# Patient Record
Sex: Male | Born: 1995
Health system: Southern US, Community
[De-identification: ages and names within clinical notes are randomized; demographics above are authoritative.]

## PROBLEM LIST (undated history)

## (undated) DIAGNOSIS — L709 Acne, unspecified: Secondary | ICD-10-CM

## (undated) DIAGNOSIS — F909 Attention-deficit hyperactivity disorder, unspecified type: Secondary | ICD-10-CM

## (undated) DIAGNOSIS — R011 Cardiac murmur, unspecified: Secondary | ICD-10-CM

## (undated) HISTORY — DX: Attention-deficit hyperactivity disorder, unspecified type: F90.9

## (undated) HISTORY — DX: Cardiac murmur, unspecified: R01.1

## (undated) HISTORY — DX: Acne, unspecified: L70.9

## (undated) HISTORY — PX: SKIN GRAFT: SHX250

---
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
## 2013-12-11 ENCOUNTER — Encounter: Payer: Self-pay | Admitting: Internal Medicine

## 2013-12-11 ENCOUNTER — Ambulatory Visit (INDEPENDENT_AMBULATORY_CARE_PROVIDER_SITE_OTHER): Payer: BC Managed Care – PPO | Admitting: Internal Medicine

## 2013-12-11 VITALS — BP 114/68 | HR 84 | Temp 97.4°F | Wt 163.1 lb

## 2013-12-11 DIAGNOSIS — F909 Attention-deficit hyperactivity disorder, unspecified type: Secondary | ICD-10-CM

## 2013-12-11 MED ORDER — AMPHETAMINE-DEXTROAMPHET ER 30 MG PO CP24: 30.0000 mg | ORAL_CAPSULE | ORAL | Status: DC

## 2013-12-11 NOTE — Progress Notes (Signed)
   Subjective:    Patient ID: Theodore Williams, male    DOB: 10/20/1995, 18 y.o.   MRN: [id number]  DOS:  12/11/2013 Type of visit - description: med Rf History: The patient has a history of ADHD, recently transferred from [employer] to [facility]. For the last year, Adderall was prescribed by another doctor but now he is back in town and needs a prescription.  Also, feels like fluid in the ears, worse on the right.  ROS ADHD symptoms we'll control Denies anxiety, depression or suicidal ideas Sleeps well as long as he takes Adderall early in AM Denies fever, chills, sinus pain. He does have some postnasal dripping. No sinus pain per se.   Past Medical History  Diagnosis Date  . ADHD (attention deficit hyperactivity disorder)   . Acne   . Heart murmur 11/21/2011    h/o    Past Surgical History  Procedure Laterality Date  . No past surgeries      History   Social History  . Marital Status: Single    Spouse Name: N/A    Number of Children: N/A  . Years of Education: N/A   Occupational History  . Not on file.   Social History Main Topics  . Smoking status: Light Tobacco Smoker  . Smokeless tobacco: Not on file  . Alcohol Use: No  . Drug Use: No  . Sexual Activity: Not on file   Other Topics Concern  . Not on file   Social History Narrative  . No narrative on file        Medication List       This list is accurate as of: 12/11/13  6:06 PM.  Always use your most recent med list.               amphetamine-dextroamphetamine 30 MG 24 hr capsule  Commonly known as:  ADDERALL XR  Take 1 capsule (30 mg total) by mouth every morning.           Objective:   Physical Exam BP 114/68  Pulse 84  Temp(Src) 97.4 F (36.3 C) (Oral)  Wt 163 lb 2 oz (73.993 kg)  SpO2 100%  General -- alert, well-developed, NAD.  HEENT-- Not pale. TMs w/ few scars, no redor bulge, no d/c ; throat symmetric, no redness or discharge. Face symmetric, sinuses not tender to  palpation. Nose  congested. Neurologic--  alert & oriented X3. Speech normal, gait appropriate for age, strength symmetric and appropriate for age. Psych-- Cognition and judgment appear intact. Cooperative with normal attention span and concentration. No anxious or depressed appearing.     Assessment & Plan:   Mild serous otitis, recommend Nasacort

## 2013-12-11 NOTE — Patient Instructions (Signed)
Go to the lab for a UDS  Call for refills of adderall as needed  For congestion use OTC Nasocort: 2 nasal sprays on each side of the nose daily   Next visit in 6 months, please make an appointment

## 2013-12-11 NOTE — Progress Notes (Signed)
Pre-visit discussion using our clinic review tool. No additional management support is needed unless otherwise documented below in the visit note.  

## 2013-12-11 NOTE — Assessment & Plan Note (Addendum)
For last year, ADHD  medication was prescribed y another provider, he just moved back in town and needs a local doctor. Current Adderall XR 30 mg works well for him, prescription provided. UDS from April 2014 was low risk, we'll recheck that today. Followup 6 months

## 2013-12-20 ENCOUNTER — Telehealth: Payer: Self-pay

## 2013-12-20 NOTE — Telephone Encounter (Signed)
UDS: 12/11/2013  Positive for Adderall.   Low Risk per Dr. Drue Novel 12/20/2013.

## 2014-02-26 ENCOUNTER — Telehealth: Payer: Self-pay | Admitting: Internal Medicine

## 2014-02-26 MED ORDER — AMPHETAMINE-DEXTROAMPHET ER 30 MG PO CP24: 30.0000 mg | ORAL_CAPSULE | ORAL | Status: DC

## 2014-02-26 NOTE — Telephone Encounter (Signed)
Pt requesting a refill amphetamine-dextroamphetamine (ADDERALL XR) 30 MG 24 hr capsule

## 2014-02-26 NOTE — Telephone Encounter (Signed)
LMOM for Pt informing him rx is ready for pick up at front desk.

## 2014-02-26 NOTE — Telephone Encounter (Signed)
Pt is requesting refill on Adderall   Last OV: 12/11/2013 Last Fill: 12/11/2013 # 30 0RF UDS: 12/11/2013 Low risk   Please advise.

## 2014-02-26 NOTE — Telephone Encounter (Signed)
done

## 2014-03-18 ENCOUNTER — Other Ambulatory Visit: Payer: Self-pay

## 2014-05-05 ENCOUNTER — Telehealth: Payer: Self-pay | Admitting: Internal Medicine

## 2014-05-05 ENCOUNTER — Ambulatory Visit (INDEPENDENT_AMBULATORY_CARE_PROVIDER_SITE_OTHER): Payer: BLUE CROSS/BLUE SHIELD | Admitting: Internal Medicine

## 2014-05-05 ENCOUNTER — Encounter: Payer: Self-pay | Admitting: Internal Medicine

## 2014-05-05 VITALS — BP 115/72 | HR 77 | Temp 98.2°F | Ht 69.0 in | Wt 157.2 lb

## 2014-05-05 DIAGNOSIS — F909 Attention-deficit hyperactivity disorder, unspecified type: Secondary | ICD-10-CM

## 2014-05-05 DIAGNOSIS — L84 Corns and callosities: Secondary | ICD-10-CM | POA: Insufficient documentation

## 2014-05-05 MED ORDER — AMPHETAMINE-DEXTROAMPHET ER 30 MG PO CP24: 30.0000 mg | ORAL_CAPSULE | ORAL | Status: DC

## 2014-05-05 NOTE — Progress Notes (Signed)
   Subjective:    Patient ID: Theodore Williams, male    DOB: [birth year], 19 y.o.   MRN: [id number]  DOS:  05/05/2014 Type of visit - description : acute Interval history: Has a callus at the left foot for a while, it hurts from time to time. Since he is here, we talk about Adderall, good compliance, uses meds  as needed, no apparent side effects   ROS Denies any anxiety or depression, occasionally has difficulty sleeping when he takes Adderall to late. Denies any redness, swelling or discharge from the left foot  Past Medical History  Diagnosis Date  . ADHD (attention deficit hyperactivity disorder)   . Acne   . Heart murmur 11/21/2011    h/o    Past Surgical History  Procedure Laterality Date  . No past surgeries      History   Social History  . Marital Status: Single    Spouse Name: N/A    Number of Children: N/A  . Years of Education: N/A   Occupational History  . Not on file.   Social History Main Topics  . Smoking status: Light Tobacco Smoker  . Smokeless tobacco: Not on file  . Alcohol Use: No  . Drug Use: No  . Sexual Activity: Not on file   Other Topics Concern  . Not on file   Social History Narrative        Medication List       This list is accurate as of: 05/05/14 11:59 PM.  Always use your most recent med list.               amphetamine-dextroamphetamine 30 MG 24 hr capsule  Commonly known as:  ADDERALL XR  Take 1 capsule (30 mg total) by mouth every morning.           Objective:   Physical Exam BP 115/72 mmHg  Pulse 77  Temp(Src) 98.2 F (36.8 C) (Oral)  Ht 5\' 9"  (1.753 m)  Wt 157 lb 4 oz (71.328 kg)  BMI 23.21 kg/m2  SpO2 100% General -- alert, well-developed, NAD.   Extremities--  no pretibial edema bilaterally  Left distal plantar area by the 3th  Toe has a 22 cm round callous, seems approximately 4-5 mm deep. No redness, swelling, discharge, tenderness. Has a  symmetric but much smaller callus on the right  side. Neurologic--  alert & oriented X3. Speech normal, gait appropriate for age, strength symmetric and appropriate for age.  Psych-- Cognition and judgment appear intact. Cooperative with normal attention span and concentration. No anxious or depressed appearing.       Assessment & Plan:

## 2014-05-05 NOTE — Assessment & Plan Note (Addendum)
plantar callus, refer to podiatry for trimming and consideration of a shoe insert to prevent further callus formation

## 2014-05-05 NOTE — Patient Instructions (Addendum)
Will refer you to a podiatrist  Call for a refill (adderall) when needed  Next visit 6 months for a physical

## 2014-05-05 NOTE — Progress Notes (Signed)
Pre visit review using our clinic review tool, if applicable. No additional management support is needed unless otherwise documented below in the visit note. 

## 2014-05-05 NOTE — Telephone Encounter (Signed)
emmi emailed °

## 2014-05-05 NOTE — Assessment & Plan Note (Signed)
UDS low risk 12-2013, refill medications today, refill when necessary. Next visit  6 months

## 2014-05-16 ENCOUNTER — Encounter: Payer: Self-pay | Admitting: Internal Medicine

## 2014-05-16 ENCOUNTER — Ambulatory Visit (INDEPENDENT_AMBULATORY_CARE_PROVIDER_SITE_OTHER): Payer: BLUE CROSS/BLUE SHIELD | Admitting: Internal Medicine

## 2014-05-16 VITALS — BP 116/75 | HR 105 | Temp 97.6°F | Ht 69.0 in | Wt 155.4 lb

## 2014-05-16 DIAGNOSIS — J020 Streptococcal pharyngitis: Secondary | ICD-10-CM

## 2014-05-16 DIAGNOSIS — J069 Acute upper respiratory infection, unspecified: Secondary | ICD-10-CM

## 2014-05-16 LAB — POCT RAPID STREP A (OFFICE): RAPID STREP A SCREEN: NEGATIVE

## 2014-05-16 NOTE — Patient Instructions (Signed)
Rest, fluids , tylenol If  cough, take Mucinex DM twice a day as needed  OTC Nasocort or Flonase : 2 nasal sprays on each side of the nose daily until you feel better   Call if not gradually better over the next  10 days Call anytime if the symptoms are severe  

## 2014-05-16 NOTE — Progress Notes (Signed)
   Subjective:    Patient ID: Theodore Williams, male    DOB: 06-17-1995, 19 y.o.   MRN: [id number]  DOS:  05/16/2014 Type of visit - description : acute Interval history: Sx started a week ago with cough, sore throat, runny nose. Currently only experienced sore throat which is mild. Med list reviewed, he has been prescribed Accutane but has not started yet   ROS Denies fever or chills Mild sinus congestion with clear discharge No nausea, vomiting, diarrhea Denies aches or unusual pains, mild cough with very small amount of clear sputum production  Past Medical History  Diagnosis Date  . ADHD (attention deficit hyperactivity disorder)   . Acne   . Heart murmur 11/21/2011    h/o    Past Surgical History  Procedure Laterality Date  . No past surgeries      History   Social History  . Marital Status: Single    Spouse Name: N/A    Number of Children: N/A  . Years of Education: N/A   Occupational History  . Not on file.   Social History Main Topics  . Smoking status: Light Tobacco Smoker  . Smokeless tobacco: Not on file  . Alcohol Use: No  . Drug Use: No  . Sexual Activity: Not on file   Other Topics Concern  . Not on file   Social History Narrative        Medication List       This list is accurate as of: 05/16/14 11:59 PM.  Always use your most recent med list.               amphetamine-dextroamphetamine 30 MG 24 hr capsule  Commonly known as:  ADDERALL XR  Take 1 capsule (30 mg total) by mouth every morning.     ISOtretinoin 40 MG capsule  Commonly known as:  ACCUTANE  Take 40 mg by mouth daily.           Objective:   Physical Exam BP 116/75 mmHg  Pulse 105  Temp(Src) 97.6 F (36.4 C) (Oral)  Ht 5\' 9"  (1.753 m)  Wt 155 lb 6 oz (70.478 kg)  BMI 22.93 kg/m2  SpO2 96% General -- alert, well-developed, NAD.    no LAD HEENT-- Not pale.  R Ear-- normal L ear-- normal Throat symmetric, no redness or discharge. Face symmetric, sinuses  not tender to palpation. Nose congested.  Lungs -- normal respiratory effort, no intercostal retractions, no accessory muscle use, and normal breath sounds.  Heart-- normal rate, regular rhythm, no murmur.  Neurologic--  alert & oriented X3. Speech normal, gait appropriate for age, strength symmetric and appropriate for age.  Psych-- Cognition and judgment appear intact. Cooperative with normal attention span and concentration. No anxious or depressed appearing.         Assessment & Plan:   URI, Chief complaint is sore throat, exam is benign, rapid strep test negative, suspect viral infection, see instructions

## 2014-05-16 NOTE — Progress Notes (Signed)
Pre visit review using our clinic review tool, if applicable. No additional management support is needed unless otherwise documented below in the visit note. 

## 2014-06-20 ENCOUNTER — Ambulatory Visit (INDEPENDENT_AMBULATORY_CARE_PROVIDER_SITE_OTHER): Payer: BLUE CROSS/BLUE SHIELD | Admitting: Internal Medicine

## 2014-06-20 ENCOUNTER — Encounter: Payer: Self-pay | Admitting: Internal Medicine

## 2014-06-20 ENCOUNTER — Ambulatory Visit: Payer: BC Managed Care – PPO | Admitting: Internal Medicine

## 2014-06-20 VITALS — BP 118/68 | HR 88 | Temp 98.0°F | Wt 158.5 lb

## 2014-06-20 DIAGNOSIS — H9203 Otalgia, bilateral: Secondary | ICD-10-CM

## 2014-06-20 MED ORDER — PREDNISONE 10 MG PO TABS: 10.0000 mg | ORAL_TABLET | Freq: Every day | ORAL | Status: DC

## 2014-06-20 NOTE — Patient Instructions (Signed)
Prednisone 2 tablets daily for 5 days Claritin 10 mg OTC 1 tablet daily as needed Consistent use of Flonase or Nasacort: 2 sprays in each side of the nose daily for at least a month. Call if you are not gradually improving

## 2014-06-20 NOTE — Progress Notes (Signed)
Pre visit review using our clinic review tool, if applicable. No additional management support is needed unless otherwise documented below in the visit note. 

## 2014-06-20 NOTE — Progress Notes (Signed)
   Subjective:    Patient ID: Theodore Williams, male    DOB: [birth year], 19 y.o.   MRN: [id number]  DOS:  06/20/2014 Type of visit - description : acute Interval history: Was outside on a cold day 2 weeks ago and developed sinus congestion, he feels better but  for the last 4 days has L>R ear pressure, "popping" Has not taken any medication for it   Review of Systems Denies fever chills No sinus pain or congestion very small amount of clear nasal discharge. No chest congestion, cough or sputum production  Past Medical History  Diagnosis Date  . ADHD (attention deficit hyperactivity disorder)   . Acne   . Heart murmur 11/21/2011    h/o    Past Surgical History  Procedure Laterality Date  . No past surgeries      History   Social History  . Marital Status: Single    Spouse Name: N/A  . Number of Children: N/A  . Years of Education: N/A   Occupational History  . Not on file.   Social History Main Topics  . Smoking status: Light Tobacco Smoker  . Smokeless tobacco: Not on file  . Alcohol Use: No  . Drug Use: No  . Sexual Activity: Not on file   Other Topics Concern  . Not on file   Social History Narrative        Medication List       This list is accurate as of: 06/20/14  1:21 PM.  Always use your most recent med list.               amphetamine-dextroamphetamine 30 MG 24 hr capsule  Commonly known as:  ADDERALL XR  Take 1 capsule (30 mg total) by mouth every morning.     ISOtretinoin 40 MG capsule  Commonly known as:  ACCUTANE  Take 40 mg by mouth daily.     predniSONE 10 MG tablet  Commonly known as:  DELTASONE  Take 1 tablet (10 mg total) by mouth daily. 2 tabs a day x 5 days           Objective:   Physical Exam BP 118/68 mmHg  Pulse 88  Temp(Src) 98 F (36.7 C) (Oral)  Wt 158 lb 8 oz (71.895 kg)  SpO2 99% General:   Well developed, well nourished . NAD.  HEENT:  Normocephalic . Face symmetric, atraumatic, nose with minimal  congestion;  Eyes: Conjunctiva: No redness or discharge Ears: TM-- both sides with evidence of previous infections ( scars), no redness, discharge. Throat-mouth:  no redness, discharge. Uvula midline. Lungs:  CTA B Normal respiratory effort, no intercostal retractions, no accessory muscle use. Heart: RRR,  no murmur.  Muscle skeletal: no pretibial edema bilaterally  Skin: Not pale. Not jaundice Neurologic:  alert & oriented X3.  Speech normal, gait appropriate for age and unassisted Psych--  Cognition and judgment appear intact.  Cooperative with normal attention span and concentration.  Behavior appropriate. No anxious or depressed appearing.        Assessment & Plan:  Ear discomfort, I don't see evidence of infection, will recommend Claritin, Flonase and a low dose of prednisone for 5 days. Call if not better

## 2014-06-25 ENCOUNTER — Telehealth: Payer: Self-pay | Admitting: Internal Medicine

## 2014-06-25 ENCOUNTER — Telehealth: Payer: Self-pay

## 2014-06-25 ENCOUNTER — Ambulatory Visit (INDEPENDENT_AMBULATORY_CARE_PROVIDER_SITE_OTHER): Payer: BLUE CROSS/BLUE SHIELD | Admitting: Emergency Medicine

## 2014-06-25 VITALS — BP 96/66 | HR 102 | Temp 100.8°F | Resp 20 | Ht 67.5 in | Wt 158.4 lb

## 2014-06-25 DIAGNOSIS — H66002 Acute suppurative otitis media without spontaneous rupture of ear drum, left ear: Secondary | ICD-10-CM | POA: Diagnosis not present

## 2014-06-25 DIAGNOSIS — J0140 Acute pansinusitis, unspecified: Secondary | ICD-10-CM | POA: Diagnosis not present

## 2014-06-25 DIAGNOSIS — J029 Acute pharyngitis, unspecified: Secondary | ICD-10-CM | POA: Diagnosis not present

## 2014-06-25 MED ORDER — PSEUDOEPHEDRINE-GUAIFENESIN ER 60-600 MG PO TB12: 1.0000 | ORAL_TABLET | Freq: Two times a day (BID) | ORAL | Status: DC

## 2014-06-25 MED ORDER — HYDROCODONE-ACETAMINOPHEN 5-325 MG PO TABS: 1.0000 | ORAL_TABLET | ORAL | Status: DC | PRN

## 2014-06-25 MED ORDER — AMOXICILLIN-POT CLAVULANATE 875-125 MG PO TABS: 1.0000 | ORAL_TABLET | Freq: Two times a day (BID) | ORAL | Status: DC

## 2014-06-25 NOTE — Patient Instructions (Signed)

## 2014-06-25 NOTE — Telephone Encounter (Signed)
Father called stating that patient has a fever 102, 103 that's not relieved much with Advil.  Son also has chills and sore throat.   Tonsils red and swollen per son.   Advice: Go to Urgent Care today.   Father and patient agreed.

## 2014-06-25 NOTE — Telephone Encounter (Signed)
[facility] Day - Client TELEPHONE ADVICE RECORD [facility] Patient Name: Theodore Williams DOB: 09/18/1995 Initial Comment Caller states his son has had a fever all day of 101-103, he went to the doctor previously for ear ache, his throat is sore, he does have red stripes in back of his throat., Would like to know what to do for the fever since it is not breaking. He would like to know if he can get an appointment for tomorrow also. Nurse Assessment Nurse: Izola PriceMyers, RN, Cala BradfordKimberly Date/Time (Eastern Time): 06/25/2014 5:24:16 PM Confirm and document reason for call. If symptomatic, describe symptoms. ---Caller states his son has had a fever all day of 101-103, he went to the doctor previously for ear ache, his throat is sore, he does have red stripes in back of his throat., Would like to know what to do for the fever since it is not breaking. He would like to know if he can get an appointment for tomorrow also. When nurse got in touch with dad he stated that the office told him to take son on in to UC tonight so he is there at this time. Has the patient traveled out of the country within the last 30 days? ---Not Applicable Does the patient require triage? ---No Please document clinical information provided and list any resource used. ---No triage needed as pt is in UC at this time. Guidelines Guideline Title Affirmed Question Affirmed Notes Final Disposition User Clinical Call Izola PriceMyers, RN, Cala BradfordKimberly

## 2014-06-25 NOTE — Progress Notes (Signed)
[facility] [address], [place] [id number] [phone]  Date:  06/25/2014   Name:  Theodore Haymakerathaniel R Barbar   DOB:  November 14, 1995   MRN:  [id number]  PCP:  Willow OraJose Paz, MD    Chief Complaint: Fever; Sore Throat; and Ear Pain   History of Present Illness:  Theodore Haymakerathaniel R Defranco is a 19 y.o. very pleasant male patient who presents with the following:  Ill since yesterday with fever of 103 Has a sore throat and pain in left ear.  On prednisone with no improvement Mucopurulent nasal drainage and post nasal drip. No cough or wheezing or shortness of breath No nausea or vomiting No stool change No rash History of frequent otitis media Denies other complaint or health concern today.   Patient Active Problem List   Diagnosis Date Noted  . Corns/callosities 05/05/2014  . Annual physical exam 11/21/2011  . Heart murmur 11/21/2011  . ADHD (attention deficit hyperactivity disorder) 11/21/2011  . Acne 11/21/2011    Past Medical History  Diagnosis Date  . ADHD (attention deficit hyperactivity disorder)   . Acne   . Heart murmur 11/21/2011    h/o    Past Surgical History  Procedure Laterality Date  . No past surgeries      History  Substance Use Topics  . Smoking status: Light Tobacco Smoker  . Smokeless tobacco: Never Used  . Alcohol Use: No    Family History  Problem Relation Age of Onset  . Hyperlipidemia Father   . Hypertension Father   . Stroke Father     Allergies  Allergen Reactions  . Sulfa Drugs Cross Reactors Nausea And Vomiting    Medication list has been reviewed and updated.  Current Outpatient Prescriptions on File Prior to Visit  Medication Sig Dispense Refill  . amphetamine-dextroamphetamine (ADDERALL XR) 30 MG 24 hr capsule Take 1 capsule (30 mg total) by mouth every morning. 30 capsule 0  . ISOtretinoin (ACCUTANE) 40 MG capsule Take 40 mg by mouth daily.    . predniSONE (DELTASONE) 10 MG tablet Take 1 tablet (10 mg total) by  mouth daily. 2 tabs a day x 5 days 10 tablet 0   No current facility-administered medications on file prior to visit.    Review of Systems:  As per HPI, otherwise negative.    Physical Examination: Filed Vitals:   06/25/14 1807  BP: 96/66  Pulse: 102  Temp: 100.8 F (38.2 C)  Resp: 20   Filed Vitals:   06/25/14 1807  Height: 5' 7.5" (1.715 m)  Weight: 158 lb 6 oz (71.838 kg)   Body mass index is 24.42 kg/(m^2). Ideal Body Weight: Weight in (lb) to have BMI = 25: 161.7  GEN: WDWN, NAD, Non-toxic, A & O x 3 HEENT: Atraumatic, Normocephalic. Neck supple. No masses, No LAD.  Erythematous oropharynx with purulent post nasal drip Ears and Nose: No external deformity.  Left OM CV: RRR, No M/G/R. No JVD. No thrill. No extra heart sounds. PULM: CTA B, no wheezes, crackles, rhonchi. No retractions. No resp. distress. No accessory muscle use. ABD: S, NT, ND, +BS. No rebound. No HSM. EXTR: No c/c/e NEURO Normal gait.  PSYCH: Normally interactive. Conversant. Not depressed or anxious appearing.  Calm demeanor.    Assessment and Plan: Left AOM Pharyngitis Sinusitis augmentin mucinex d  Signed,  Phillips OdorJeffery Sharlyn Odonnel, MD

## 2014-06-26 NOTE — Telephone Encounter (Signed)
Was seen at a UC

## 2014-06-26 NOTE — Telephone Encounter (Signed)
Noted.  Message routed to Dr. Paz for FYI.   

## 2014-07-23 ENCOUNTER — Other Ambulatory Visit: Payer: Self-pay

## 2014-08-04 ENCOUNTER — Telehealth: Payer: Self-pay | Admitting: Internal Medicine

## 2014-08-04 NOTE — Telephone Encounter (Signed)
Relation to pt: self  Call back number: [phone]   Reason for call:  Pt requesting a refill amphetamine-dextroamphetamine (ADDERALL XR) 30 MG 24 hr capsule

## 2014-08-04 NOTE — Telephone Encounter (Signed)
Okay print 2 prescriptions 

## 2014-08-04 NOTE — Telephone Encounter (Signed)
Pt is requesting refill on Adderall.   Last OV: 05/16/2014 Last Fill: 05/05/2014 #30 0RF UDS: 12/11/2013 Low risk  Please advise.

## 2014-08-05 MED ORDER — AMPHETAMINE-DEXTROAMPHET ER 30 MG PO CP24: 30.0000 mg | ORAL_CAPSULE | ORAL | Status: DC

## 2014-08-05 NOTE — Telephone Encounter (Signed)
2 Rx printed, awaiting MD signature.

## 2014-08-05 NOTE — Telephone Encounter (Signed)
LMOM informing Pt that Rx's is ready for pick up at front desk.

## 2014-08-14 ENCOUNTER — Other Ambulatory Visit: Payer: Self-pay

## 2014-10-07 ENCOUNTER — Telehealth: Payer: Self-pay | Admitting: Internal Medicine

## 2014-10-07 NOTE — Telephone Encounter (Signed)
Caller name: nathan Relation to pt: Call back number: [phone] Pharmacy:  Reason for call:   Requesting adderall rx

## 2014-10-07 NOTE — Telephone Encounter (Signed)
Ok # 30 no refills

## 2014-10-07 NOTE — Telephone Encounter (Signed)
Pt is requesting refill on Adderall.   Last OV: 06/20/2014  Last Fill: 08/05/2014 #30 0RF UDS: 12/11/2013 Low risk  Please advise.

## 2014-10-08 MED ORDER — AMPHETAMINE-DEXTROAMPHET ER 30 MG PO CP24: 30.0000 mg | ORAL_CAPSULE | ORAL | Status: DC

## 2014-10-08 NOTE — Telephone Encounter (Signed)
Rx placed at front desk for pick up at Pt's convenience.  

## 2014-10-08 NOTE — Telephone Encounter (Signed)
Left message informing patient of this.  °

## 2014-10-08 NOTE — Telephone Encounter (Signed)
Rx printed, awaiting MD signature.  

## 2014-10-21 ENCOUNTER — Telehealth: Payer: Self-pay | Admitting: Internal Medicine

## 2014-10-21 NOTE — Telephone Encounter (Signed)
c 

## 2014-11-04 ENCOUNTER — Telehealth: Payer: Self-pay | Admitting: *Deleted

## 2014-11-04 NOTE — Telephone Encounter (Signed)
Unable to reach patient at time of Pre-Visit Call.  Left message for patient to return call when available.    

## 2014-11-05 ENCOUNTER — Ambulatory Visit (INDEPENDENT_AMBULATORY_CARE_PROVIDER_SITE_OTHER): Payer: BLUE CROSS/BLUE SHIELD | Admitting: Internal Medicine

## 2014-11-05 ENCOUNTER — Encounter: Payer: Self-pay | Admitting: Internal Medicine

## 2014-11-05 VITALS — BP 118/64 | HR 71 | Temp 97.8°F | Ht 68.0 in | Wt 159.1 lb

## 2014-11-05 DIAGNOSIS — Z23 Encounter for immunization: Secondary | ICD-10-CM

## 2014-11-05 DIAGNOSIS — Z0000 Encounter for general adult medical examination without abnormal findings: Secondary | ICD-10-CM

## 2014-11-05 DIAGNOSIS — F909 Attention-deficit hyperactivity disorder, unspecified type: Secondary | ICD-10-CM

## 2014-11-05 MED ORDER — AMPHETAMINE-DEXTROAMPHET ER 20 MG PO CP24: 20.0000 mg | ORAL_CAPSULE | Freq: Every day | ORAL | Status: DC

## 2014-11-05 NOTE — Assessment & Plan Note (Signed)
Symptoms not completely well-controlled with Adderall XR 30 mg, increase to Adderall XR 20 mg one or 2 in the morning or one twice a day. See instructions. Follow-up 3 months.

## 2014-11-05 NOTE — Patient Instructions (Signed)
Get your blood work before you leave   Take Adderall XR 20 mg: Take 1 or 2  tablets in the morning as needed OR one tablet in the morning and another tablet at around 2 PM

## 2014-11-05 NOTE — Progress Notes (Signed)
Subjective:    Patient ID: Theodore Williams, male    DOB: 1995/12/10, 19 y.o.   MRN: [id number]  DOS:  11/05/2014 Type of visit - description : CPX Interval history:  History of ADD, on Adderall XR 30 mg, not working as well as it used to. He is trying to study by himself and has still a difficult time focusing. Increase the dose?Marland Kitchen Denies side effects, no anxiety - depression  Review of Systems  Constitutional: No fever. No chills. No unexplained wt changes. No unusual sweats  HEENT: No dental problems, no ear discharge, no facial swelling, no voice changes. No eye discharge, no eye  redness , no  intolerance to light   Respiratory: No wheezing , no  difficulty breathing. No cough , no mucus production  Cardiovascular: No CP, no leg swelling , no  Palpitations  GI: no nausea, no vomiting, no diarrhea , no  abdominal pain.  No blood in the stools. No dysphagia, no odynophagia    Endocrine: No polyphagia, no polyuria , no polydipsia  GU: No dysuria, gross hematuria, difficulty urinating. No urinary urgency, no frequency.  Musculoskeletal: No joint swellings or unusual aches or pains  Skin: No change in the color of the skin, palor , no  Rash  Allergic, immunologic: No environmental allergies , no  food allergies  Neurological: No dizziness no  syncope. No headaches. No diplopia, no slurred, no slurred speech, no motor deficits, no facial  Numbness  Hematological: No enlarged lymph nodes, no easy bruising , no unusual bleedings  Psychiatry: No suicidal ideas, no hallucinations, no beavior problems, no confusion.  No unusual/severe anxiety, no depression   Past Medical History  Diagnosis Date  . ADHD (attention deficit hyperactivity disorder)   . Acne   . Heart murmur 11/21/2011    h/o    Past Surgical History  Procedure Laterality Date  . No past surgeries      History   Social History  . Marital Status: Single    Spouse Name: N/A  . Number of Children: N/A    . Years of Education: N/A   Occupational History  . Not on file.   Social History Main Topics  . Smoking status: Light Tobacco Smoker  . Smokeless tobacco: Never Used  . Alcohol Use: No  . Drug Use: No  . Sexual Activity: Not on file   Other Topics Concern  . Not on file   Social History Narrative     Family History  Problem Relation Age of Onset  . Hyperlipidemia Father   . Hypertension Father   . Stroke Father   . Colon cancer Neg Hx   . Prostate cancer Neg Hx        Medication List       This list is accurate as of: 11/05/14  9:23 AM.  Always use your most recent med list.               amphetamine-dextroamphetamine 30 MG 24 hr capsule  Commonly known as:  ADDERALL XR  Take 1 capsule (30 mg total) by mouth every morning.     HYDROcodone-acetaminophen 5-325 MG per tablet  Commonly known as:  NORCO  Take 1-2 tablets by mouth every 4 (four) hours as needed.     ISOtretinoin 40 MG capsule  Commonly known as:  ACCUTANE  Take 40 mg by mouth daily.           Objective:   Physical Exam BP 118/64 mmHg  Pulse 71  Temp(Src) 97.8 F (36.6 C) (Oral)  Ht 5\' 8"  (1.727 m)  Wt 159 lb 2 oz (72.179 kg)  BMI 24.20 kg/m2  SpO2 96%    General:   Well developed, well nourished . NAD.  HEENT:  Normocephalic . Face symmetric, atraumatic. No thyromegaly Lungs:  CTA B Normal respiratory effort, no intercostal retractions, no accessory muscle use. Heart: RRR,  no murmur.  no pretibial edema bilaterally  Abdomen:  Not distended, soft, non-tender. No rebound or rigidity.   Skin: Not pale. Not jaundice Neurologic:  alert & oriented X3.  Speech normal, gait appropriate for age and unassisted Psych--  Cognition and judgment appear intact.  Cooperative with normal attention span and concentration.  Behavior appropriate. No anxious or depressed appearing.  Assessment & Plan:

## 2014-11-05 NOTE — Progress Notes (Signed)
Pre visit review using our clinic review tool, if applicable. No additional management support is needed unless otherwise documented below in the visit note. 

## 2014-11-05 NOTE — Assessment & Plan Note (Addendum)
Immunizations: meningococcal shot, MMR #2 and Tdap  today. We'll discuss hepatitis A, varicela and HPV at the next opportunity   Labs  Counseled about diet, exercise, self testicular exam, risks of tobacco, alcohol. Also safe driving, safe sex.

## 2014-11-13 ENCOUNTER — Other Ambulatory Visit (INDEPENDENT_AMBULATORY_CARE_PROVIDER_SITE_OTHER): Payer: BLUE CROSS/BLUE SHIELD

## 2014-11-13 DIAGNOSIS — Z0000 Encounter for general adult medical examination without abnormal findings: Secondary | ICD-10-CM | POA: Diagnosis not present

## 2014-11-13 LAB — COMPREHENSIVE METABOLIC PANEL
ALBUMIN: 4.3 g/dL (ref 3.5–5.2)
ALT: 13 U/L (ref 0–53)
AST: 19 U/L (ref 0–37)
Alkaline Phosphatase: 94 U/L (ref 39–117)
BUN: 14 mg/dL (ref 6–23)
CO2: 29 meq/L (ref 19–32)
Calcium: 9.8 mg/dL (ref 8.4–10.5)
Chloride: 101 mEq/L (ref 96–112)
Creatinine, Ser: 0.94 mg/dL (ref 0.40–1.50)
GFR: 107.13 mL/min (ref >60.00)
GLUCOSE: 89 mg/dL (ref 70–99)
Potassium: 3.8 mEq/L (ref 3.5–5.1)
Sodium: 138 mEq/L (ref 135–145)
Total Bilirubin: 0.6 mg/dL (ref 0.2–1.2)
Total Protein: 7.2 g/dL (ref 6.0–8.3)

## 2014-11-13 LAB — CBC WITH DIFFERENTIAL/PLATELET
BASOS PCT: 0.5 % (ref 0.0–3.0)
Basophils Absolute: 0 10*3/uL (ref 0.0–0.1)
Eosinophils Absolute: 0.1 10*3/uL (ref 0.0–0.7)
Eosinophils Relative: 1.9 % (ref 0.0–5.0)
HCT: 50.4 % (ref 39.0–52.0)
Hemoglobin: 17.4 g/dL — ABNORMAL HIGH (ref 13.0–17.0)
LYMPHS ABS: 2.9 10*3/uL (ref 0.7–4.0)
LYMPHS PCT: 46.3 % — AB (ref 12.0–46.0)
MCHC: 34.4 g/dL (ref 30.0–36.0)
MCV: 87.2 fl (ref 78.0–100.0)
MONO ABS: 0.7 10*3/uL (ref 0.1–1.0)
MONOS PCT: 10.4 % (ref 3.0–12.0)
NEUTROS PCT: 40.9 % — AB (ref 43.0–77.0)
Neutro Abs: 2.6 10*3/uL (ref 1.4–7.7)
Platelets: 188 10*3/uL (ref 150.0–400.0)
RBC: 5.78 Mil/uL (ref 4.22–5.81)
RDW: 13.2 % (ref 11.5–15.5)
WBC: 6.4 10*3/uL (ref 4.0–10.5)

## 2014-11-13 LAB — TSH: TSH: 3.81 u[IU]/mL (ref 0.35–4.50)

## 2014-11-13 LAB — CHOLESTEROL, TOTAL: CHOLESTEROL: 237 mg/dL — AB (ref 0–200)

## 2014-12-06 ENCOUNTER — Ambulatory Visit (INDEPENDENT_AMBULATORY_CARE_PROVIDER_SITE_OTHER): Payer: BLUE CROSS/BLUE SHIELD | Admitting: Family Medicine

## 2014-12-06 VITALS — BP 116/78 | HR 88 | Temp 98.3°F | Resp 16 | Ht 68.0 in | Wt 158.4 lb

## 2014-12-06 DIAGNOSIS — J029 Acute pharyngitis, unspecified: Secondary | ICD-10-CM | POA: Diagnosis not present

## 2014-12-06 LAB — POCT RAPID STREP A (OFFICE): RAPID STREP A SCREEN: NEGATIVE

## 2014-12-06 MED ORDER — AMOXICILLIN 500 MG PO CAPS: 1000.0000 mg | ORAL_CAPSULE | Freq: Two times a day (BID) | ORAL | Status: DC

## 2014-12-06 NOTE — Patient Instructions (Signed)

## 2014-12-06 NOTE — Progress Notes (Signed)
Subjective:    Patient ID: Theodore Williams, male    DOB: 27-Aug-1995, 19 y.o.   MRN: [id number]  12/06/2014  Sore Throat and Nasal Congestion   HPI This 19 y.o. male presents for evaluation of sore throat and nasal congestion.  Onset two days ago.  No fver/chills/sweats.  No headache.  +ear pain some B.  +ST diffuse; +pain with swallowing at time; no pain with talking or opening mouth.  +nasal congestion ;+rhinorrhea; no cough.  No n/v/d.  No rash.  No abdominal pain.  No medications.  No tobacco abuse.  IT work.  +sick contacts at work but headache.     Review of Systems  Constitutional: Negative for fever, chills, diaphoresis and fatigue.  HENT: Positive for congestion, ear pain, rhinorrhea, sore throat and trouble swallowing. Negative for postnasal drip and voice change.   Respiratory: Negative for cough and shortness of breath.   Gastrointestinal: Negative for nausea, vomiting, abdominal pain and diarrhea.  Skin: Negative for rash.  Neurological: Negative for headaches.    Past Medical History  Diagnosis Date  . ADHD (attention deficit hyperactivity disorder)   . Acne   . Heart murmur 11/21/2011    h/o   Past Surgical History  Procedure Laterality Date  . No past surgeries     Allergies  Allergen Reactions  . Sulfa Drugs Cross Reactors Nausea And Vomiting   Current Outpatient Prescriptions  Medication Sig Dispense Refill  . amphetamine-dextroamphetamine (ADDERALL XR) 20 MG 24 hr capsule Take 1-2 capsules (20-40 mg total) by mouth daily. 60 capsule 0  . ISOtretinoin (ACCUTANE) 40 MG capsule Take 40 mg by mouth daily.    Marland Kitchen amoxicillin (AMOXIL) 500 MG capsule Take 2 capsules (1,000 mg total) by mouth 2 (two) times daily. 40 capsule 0   No current facility-administered medications for this visit.   Social History   Social History  . Marital Status: Single    Spouse Name: N/A  . Number of Children: 0  . Years of Education: N/A   Occupational History  . student,  independent, no college    Social History Main Topics  . Smoking status: Former Games developer  . Smokeless tobacco: Never Used  . Alcohol Use: 0.0 oz/week    0 Standard drinks or equivalent per week     Comment: wine sometimes  . Drug Use: No  . Sexual Activity: Not on file   Other Topics Concern  . Not on file   Social History Narrative   Lives w/ parents        Objective:    BP 116/78 mmHg  Pulse 88  Temp(Src) 98.3 F (36.8 C) (Oral)  Resp 16  Ht  (1.727 m)  Wt 158 lb 6.4 oz (71.85 kg)  BMI 24.09 kg/m2  SpO2 98% Physical Exam  Constitutional: He is oriented to person, place, and time. He appears well-developed and well-nourished. No distress.  HENT:  Head: Normocephalic and atraumatic.  Right Ear: Tympanic membrane, external ear and ear canal normal.  Left Ear: Tympanic membrane, external ear and ear canal normal.  Nose: Nose normal.  Mouth/Throat: Uvula is midline and mucous membranes are normal. Posterior oropharyngeal erythema present. No oropharyngeal exudate, posterior oropharyngeal edema or tonsillar abscesses.  Eyes: Conjunctivae and EOM are normal. Pupils are equal, round, and reactive to light.  Neck: Normal range of motion. Neck supple. Carotid bruit is not present. No thyromegaly present.  Cardiovascular: Normal rate, regular rhythm and intact distal pulses.  Exam reveals no  gallop and no friction rub.   Murmur heard.  Systolic murmur is present with a grade of 2/6  Pulmonary/Chest: Effort normal and breath sounds normal. He has no wheezes. He has no rales.  Lymphadenopathy:    He has no cervical adenopathy.  Neurological: He is alert and oriented to person, place, and time. No cranial nerve deficit.  Skin: Skin is warm and dry. No rash noted. He is not diaphoretic.  Psychiatric: He has a normal mood and affect. His behavior is normal.  Nursing note and vitals reviewed.  Results for orders placed or performed in visit on 12/06/14  POCT rapid strep A    Result Value Ref Range   Rapid Strep A Screen Negative Negative       Assessment & Plan:   1. Sore throat    -New. -Send throat culture. -Pt desires treating empirically with Amoxicillin while awaiting throat culture results. -Supportive care with rest, fluids, Ibuprofen. -RTC inability to swallow. -If sore throat still present in 7-10 days, RTC for EBV titers.   Meds ordered this encounter  Medications  . amoxicillin (AMOXIL) 500 MG capsule    Sig: Take 2 capsules (1,000 mg total) by mouth 2 (two) times daily.    Dispense:  40 capsule    Refill:  0    No Follow-up on file.     Rhythm Gubbels Paulita Fujita, M.D. [facility] [address] [place], [place]  [address] [phone] phone [phone] fax

## 2014-12-07 LAB — CULTURE, GROUP A STREP: ORGANISM ID, BACTERIA: NORMAL

## 2014-12-24 ENCOUNTER — Telehealth: Payer: Self-pay | Admitting: Internal Medicine

## 2014-12-24 MED ORDER — AMPHETAMINE-DEXTROAMPHET ER 20 MG PO CP24: 20.0000 mg | ORAL_CAPSULE | Freq: Every day | ORAL | Status: DC

## 2014-12-24 NOTE — Telephone Encounter (Signed)
Father will p/u

## 2014-12-24 NOTE — Telephone Encounter (Signed)
Caller name:Nathan Relationship to patient:self Can be reached:[phone] Pharmacy:  Reason for call:adderall xr 20 mg

## 2014-12-24 NOTE — Telephone Encounter (Signed)
Pt is requesting refill on Adderall.  Last OV: 11/05/2014 Last Fill: 11/05/2014 #60 0RF UDS: 12/11/2013 Low risk  Pt due for UDS at time of Rx pick up.  Please advise.

## 2014-12-24 NOTE — Telephone Encounter (Signed)
Rx's for September and October 2016 printed, awaiting MD signature.  

## 2014-12-24 NOTE — Telephone Encounter (Signed)
LMOM informing Pt that Rx has been placed at front desk for pick up at his earliest convenience.

## 2014-12-24 NOTE — Telephone Encounter (Signed)
Okay for 2 prescriptions 

## 2015-01-17 ENCOUNTER — Emergency Department (HOSPITAL_COMMUNITY): Payer: BLUE CROSS/BLUE SHIELD

## 2015-01-17 ENCOUNTER — Encounter (HOSPITAL_COMMUNITY): Payer: Self-pay | Admitting: Emergency Medicine

## 2015-01-17 ENCOUNTER — Emergency Department (HOSPITAL_COMMUNITY)
Admission: EM | Admit: 2015-01-17 | Discharge: 2015-01-17 | Disposition: A | Discharge status: Home / Self Care | Payer: BLUE CROSS/BLUE SHIELD | Attending: Emergency Medicine | Admitting: Emergency Medicine

## 2015-01-17 DIAGNOSIS — S60512A Abrasion of left hand, initial encounter: Secondary | ICD-10-CM | POA: Insufficient documentation

## 2015-01-17 DIAGNOSIS — S80212A Abrasion, left knee, initial encounter: Secondary | ICD-10-CM | POA: Diagnosis not present

## 2015-01-17 DIAGNOSIS — S40212A Abrasion of left shoulder, initial encounter: Secondary | ICD-10-CM | POA: Insufficient documentation

## 2015-01-17 DIAGNOSIS — T07XXXA Unspecified multiple injuries, initial encounter: Secondary | ICD-10-CM

## 2015-01-17 DIAGNOSIS — S70312A Abrasion, left thigh, initial encounter: Secondary | ICD-10-CM | POA: Insufficient documentation

## 2015-01-17 DIAGNOSIS — Y998 Other external cause status: Secondary | ICD-10-CM | POA: Insufficient documentation

## 2015-01-17 DIAGNOSIS — S70311A Abrasion, right thigh, initial encounter: Secondary | ICD-10-CM | POA: Insufficient documentation

## 2015-01-17 DIAGNOSIS — Y9389 Activity, other specified: Secondary | ICD-10-CM | POA: Insufficient documentation

## 2015-01-17 DIAGNOSIS — S90812A Abrasion, left foot, initial encounter: Secondary | ICD-10-CM | POA: Insufficient documentation

## 2015-01-17 DIAGNOSIS — Y92410 Unspecified street and highway as the place of occurrence of the external cause: Secondary | ICD-10-CM | POA: Diagnosis not present

## 2015-01-17 DIAGNOSIS — S80812A Abrasion, left lower leg, initial encounter: Secondary | ICD-10-CM | POA: Diagnosis present

## 2015-01-17 DIAGNOSIS — T1490XA Injury, unspecified, initial encounter: Secondary | ICD-10-CM

## 2015-01-17 MED ORDER — HYDROMORPHONE HCL 1 MG/ML IJ SOLN
1.0000 mg | Freq: Once | INTRAMUSCULAR | Status: AC
  Administered 2015-01-17: 1 mg via INTRAMUSCULAR
  Filled 2015-01-17: qty 1

## 2015-01-17 MED ORDER — METHOCARBAMOL 750 MG PO TABS: 750.0000 mg | ORAL_TABLET | Freq: Four times a day (QID) | ORAL | Status: DC

## 2015-01-17 MED ORDER — BACITRACIN ZINC 500 UNIT/GM EX OINT
TOPICAL_OINTMENT | CUTANEOUS | Status: AC
  Administered 2015-01-17: 03:00:00
  Filled 2015-01-17: qty 7.2

## 2015-01-17 MED ORDER — OXYCODONE-ACETAMINOPHEN 5-325 MG PO TABS
2.0000 | ORAL_TABLET | Freq: Once | ORAL | Status: AC
  Administered 2015-01-17: 2 via ORAL
  Filled 2015-01-17: qty 2

## 2015-01-17 MED ORDER — OXYCODONE-ACETAMINOPHEN 5-325 MG PO TABS: 2.0000 | ORAL_TABLET | ORAL | Status: DC | PRN
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
## 2015-01-17 NOTE — ED Notes (Signed)
Abrasions to left wrist, left thigh/knee/leg, right thigh/knee and left foot cleansed with normal saline. Bacitracin applied. Covered with sterile gauze.   Pt verbalized understanding of dc instructions, prescriptions an follow up care.
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
## 2015-01-17 NOTE — Discharge Instructions (Signed)

## 2015-01-20 ENCOUNTER — Telehealth: Payer: Self-pay | Admitting: Internal Medicine

## 2015-01-20 NOTE — Telephone Encounter (Signed)
Of course he needs to try Tylenol or Motrin OTC first, then a stronger medication if still has moderate pain. We could try hydrocodone (vicodin) but that may also cause him to be drowsy. A prescription for hydrocodone is ready in case they like to try.
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
## 2015-01-20 NOTE — Telephone Encounter (Signed)
Spoke with Pt's mother, informed her of Dr. Leta Jungling recommendations. Pt's mother decided to use some of her left over Hydrocodone prescription she has at home. She has the same strength she believes. Informed her to keep F/U appt tomorrow with Dr. Drue Novel. Pt's mother verbalized understanding.

## 2015-01-20 NOTE — Telephone Encounter (Signed)
Pt given Oxycodone. Making Pt to sleepy. Pt's mother wondering if Pt can get something for pain that will not make him drowsy.

## 2015-01-21 ENCOUNTER — Ambulatory Visit (INDEPENDENT_AMBULATORY_CARE_PROVIDER_SITE_OTHER): Payer: BLUE CROSS/BLUE SHIELD | Admitting: Internal Medicine

## 2015-01-21 ENCOUNTER — Encounter: Payer: Self-pay | Admitting: Internal Medicine

## 2015-01-21 VITALS — BP 106/72 | HR 88 | Temp 97.7°F | Ht 68.0 in | Wt 160.4 lb

## 2015-01-21 DIAGNOSIS — S8002XD Contusion of left knee, subsequent encounter: Secondary | ICD-10-CM | POA: Diagnosis not present

## 2015-01-21 MED ORDER — KETOROLAC TROMETHAMINE 10 MG PO TABS: 10.0000 mg | ORAL_TABLET | Freq: Four times a day (QID) | ORAL | Status: DC | PRN

## 2015-01-21 MED ORDER — HYDROCODONE-ACETAMINOPHEN 5-325 MG PO TABS: 1.0000 | ORAL_TABLET | Freq: Three times a day (TID) | ORAL | Status: DC | PRN

## 2015-01-21 NOTE — Progress Notes (Signed)
Pre visit review using our clinic review tool, if applicable. No additional management support is needed unless otherwise documented below in the visit note. 

## 2015-01-21 NOTE — Patient Instructions (Signed)
Take ketorolac up to 4 times a day as needed for pain. ICE the knee twice a day If the pain continue, use hydrocodone as needed. Will cause drowsiness  We are referring you to a orthopedic doctor
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
## 2015-02-05 ENCOUNTER — Ambulatory Visit: Payer: BLUE CROSS/BLUE SHIELD | Admitting: Internal Medicine

## 2015-02-17 ENCOUNTER — Encounter: Payer: Self-pay | Admitting: Internal Medicine

## 2015-02-17 ENCOUNTER — Ambulatory Visit (INDEPENDENT_AMBULATORY_CARE_PROVIDER_SITE_OTHER): Payer: BLUE CROSS/BLUE SHIELD | Admitting: Internal Medicine

## 2015-02-17 VITALS — BP 122/68 | HR 80 | Temp 98.2°F | Ht 68.0 in | Wt 156.4 lb

## 2015-02-17 DIAGNOSIS — Z09 Encounter for follow-up examination after completed treatment for conditions other than malignant neoplasm: Secondary | ICD-10-CM

## 2015-02-17 DIAGNOSIS — F909 Attention-deficit hyperactivity disorder, unspecified type: Secondary | ICD-10-CM | POA: Diagnosis not present

## 2015-02-17 MED ORDER — AMPHETAMINE-DEXTROAMPHET ER 20 MG PO CP24: 20.0000 mg | ORAL_CAPSULE | Freq: Every day | ORAL | Status: DC

## 2015-02-17 NOTE — Patient Instructions (Signed)
Go to the lab for a urine test  Next visit in 6 months

## 2015-02-17 NOTE — Progress Notes (Signed)
   Subjective:    Patient ID: Theodore Williams, male    DOB: June 13, 1995, 19 y.o.   MRN: [id number]  DOS:  02/17/2015 Type of visit - description : Routine checkup Interval history: ADD: Symptoms well-controlled with Adderall 20 mg twice a day when necessary. Knee contusion: Saw orthopedic surgery, improving.  Wt Readings from Last 3 Encounters:  02/17/15 156 lb 6 oz (70.931 kg)  01/21/15 160 lb 6 oz (72.746 kg)  12/06/14 158 lb 6.4 oz (71.85 kg)     Review of Systems  Denies any anxiety, depression. No difficulty sleeping. Has lost 4 pounds since the last time he was here but is not far from his baseline.  Past Medical History  Diagnosis Date  . ADHD (attention deficit hyperactivity disorder)   . Acne   . Heart murmur 11/21/2011    h/o    Past Surgical History  Procedure Laterality Date  . No past surgeries      Social History   Social History  . Marital Status: Single    Spouse Name: N/A  . Number of Children: 0  . Years of Education: N/A   Occupational History  . student, independent, no college    Social History Main Topics  . Smoking status: Former Games developermoker  . Smokeless tobacco: Never Used  . Alcohol Use: 0.0 oz/week    0 Standard drinks or equivalent per week     Comment: wine sometimes  . Drug Use: No  . Sexual Activity: Not on file   Other Topics Concern  . Not on file   Social History Narrative   Lives w/ parents        Medication List       This list is accurate as of: 02/17/15 11:59 PM.  Always use your most recent med list.               amphetamine-dextroamphetamine 20 MG 24 hr capsule  Commonly known as:  ADDERALL XR  Take 1-2 capsules (20-40 mg total) by mouth daily.     amphetamine-dextroamphetamine 20 MG 24 hr capsule  Commonly known as:  ADDERALL XR  Take 1-2 capsules (20-40 mg total) by mouth daily.     ISOtretinoin 40 MG capsule  Commonly known as:  ACCUTANE  Take 40 mg by mouth daily.           Objective:   Physical Exam BP 122/68 mmHg  Pulse 80  Temp(Src) 98.2 F (36.8 C) (Oral)  Ht 5\' 8"  (1.727 m)  Wt 156 lb 6 oz (70.931 kg)  BMI 23.78 kg/m2  SpO2 99% General:   Well developed, well nourished . NAD.  HEENT:  Normocephalic . Face symmetric, atraumatic Lungs:  CTA B Normal respiratory effort, no intercostal retractions, no accessory muscle use. Heart: RRR,  no murmur.  No pretibial edema bilaterally  Skin: Not pale. Not jaundice Neurologic:  alert & oriented X3.  Speech normal, gait appropriate for age and unassisted Psych--  Cognition and judgment appear intact.  Cooperative with normal attention span and concentration.  Behavior appropriate. No anxious or depressed appearing.      Assessment & Plan:   ADD: Symptoms well-controlled with  increased in Adderall dose. No apparent complications. he did lost 4 pounds since the last visit, I asked him to monitor his weight weekly and call if he lost 4 additional pounds. Return to the office 6 months Check a UDS

## 2015-02-17 NOTE — Progress Notes (Signed)
Pre visit review using our clinic review tool, if applicable. No additional management support is needed unless otherwise documented below in the visit note. 

## 2015-02-18 DIAGNOSIS — Z09 Encounter for follow-up examination after completed treatment for conditions other than malignant neoplasm: Secondary | ICD-10-CM | POA: Insufficient documentation

## 2015-02-18 NOTE — Assessment & Plan Note (Signed)
Symptoms well-controlled with  increased in Adderall dose. No apparent complications. he did lost 4 pounds since the last visit, I asked him to monitor his weight weekly and call if he lost 4 additional pounds. Return to the office 6 months Check a UDS

## 2015-02-26 ENCOUNTER — Telehealth: Payer: Self-pay

## 2015-02-26 NOTE — Telephone Encounter (Signed)
UDS: 02/17/2015  Positive for Adderall    Low risk per Dr. Drue NovelPaz 02/26/2015

## 2015-06-03 ENCOUNTER — Other Ambulatory Visit: Payer: Self-pay | Admitting: Internal Medicine

## 2015-06-03 MED ORDER — AMPHETAMINE-DEXTROAMPHET ER 20 MG PO CP24: 20.0000 mg | ORAL_CAPSULE | Freq: Every day | ORAL | Status: DC

## 2015-06-03 NOTE — Telephone Encounter (Signed)
Pt informed via MyChart that Rx's are ready for pick up at front desk at his convenience.  

## 2015-06-03 NOTE — Telephone Encounter (Signed)
Pt is requesting refill on Adderall.  Last OV: 02/17/2015 Last Fill: 02/17/2015 #60 and 0RF For November and December 2016  UDS: 02/17/2015 Low risk  Please advise.

## 2015-06-03 NOTE — Telephone Encounter (Signed)
Ok 2 prescriptions  

## 2015-06-03 NOTE — Telephone Encounter (Signed)
Rx's for February and March 2017 printed, awaiting MD signature.  

## 2015-06-15 ENCOUNTER — Telehealth: Payer: Self-pay | Admitting: Internal Medicine

## 2015-06-15 NOTE — Telephone Encounter (Signed)
LM for pt regarding below

## 2015-06-15 NOTE — Telephone Encounter (Signed)
Pt was given refills for February and March 2017 on 06/03/2015. He should not need refills until April. If he has misplaced the Rx for March 2017 he will need to file a police report for [place] to prescribe early per the [facility] regulations.

## 2015-06-15 NOTE — Telephone Encounter (Signed)
Pt called to refill adderall. He has 5 left. He said last time he got 2 rx's (2 months). Please call when ready to pick up.

## 2015-06-16 NOTE — Telephone Encounter (Signed)
Pt called in to check the status of his Rx. Informed pt of the below. Pt says that he was never notified to pick up Rx . Informed pt that it is ready for pick up. He says that he will be in today for pick up.

## 2015-08-14 ENCOUNTER — Telehealth: Payer: Self-pay | Admitting: Internal Medicine

## 2015-08-14 ENCOUNTER — Other Ambulatory Visit: Payer: Self-pay

## 2015-08-14 MED ORDER — AMPHETAMINE-DEXTROAMPHET ER 20 MG PO CP24: 20.0000 mg | ORAL_CAPSULE | Freq: Every day | ORAL | Status: DC

## 2015-08-14 NOTE — Telephone Encounter (Signed)
Printed for [employer] signature

## 2015-08-14 NOTE — Telephone Encounter (Signed)
Relation to MV:HQIOpt:self Call back number:[phone]   Reason for call:  Patient requesting a refill amphetamine-dextroamphetamine (ADDERALL XR) 20 MG 24 hr capsule

## 2015-08-17 ENCOUNTER — Ambulatory Visit: Payer: BLUE CROSS/BLUE SHIELD | Admitting: Internal Medicine

## 2015-09-09 ENCOUNTER — Encounter: Payer: Self-pay | Admitting: Internal Medicine

## 2015-09-09 ENCOUNTER — Ambulatory Visit (INDEPENDENT_AMBULATORY_CARE_PROVIDER_SITE_OTHER): Payer: Managed Care, Other (non HMO) | Admitting: Internal Medicine

## 2015-09-09 VITALS — BP 132/84 | HR 80 | Temp 98.1°F | Ht 68.0 in | Wt 162.4 lb

## 2015-09-09 DIAGNOSIS — F411 Generalized anxiety disorder: Secondary | ICD-10-CM

## 2015-09-09 DIAGNOSIS — F909 Attention-deficit hyperactivity disorder, unspecified type: Secondary | ICD-10-CM

## 2015-09-09 DIAGNOSIS — Q210 Ventricular septal defect: Secondary | ICD-10-CM | POA: Diagnosis not present

## 2015-09-09 DIAGNOSIS — R011 Cardiac murmur, unspecified: Secondary | ICD-10-CM | POA: Diagnosis not present

## 2015-09-09 MED ORDER — AMPHETAMINE-DEXTROAMPHET ER 20 MG PO CP24: 20.0000 mg | ORAL_CAPSULE | Freq: Every day | ORAL | Status: DC

## 2015-09-09 MED ORDER — ESCITALOPRAM OXALATE 10 MG PO TABS: 10.0000 mg | ORAL_TABLET | Freq: Every day | ORAL | Status: DC

## 2015-09-09 NOTE — Patient Instructions (Signed)
  GO TO THE FRONT DESK Schedule your next appointment for a  Follow up in 4 weeks

## 2015-09-09 NOTE — Progress Notes (Signed)
Pre visit review using our clinic review tool, if applicable. No additional management support is needed unless otherwise documented below in the visit note. 

## 2015-09-09 NOTE — Progress Notes (Signed)
Subjective:    Patient ID: Theodore Williams, male    DOB: 12-11-1995, 20 y.o.   MRN: [id number]  DOS:  09/09/2015 Type of visit - description : Follow-up Interval history: ADD: Symptoms well-controlled with Adderall, no apparent side effects.  Reports palpitations for the last 2 weeks: Symptoms happened at work, last few minutes, palpitations associated with sweats and feeling really anxious. Reports that environment at work has become more stressful over the last 2 weeks. No loss of consciousness but experienced mild dizziness when symptoms happened.   Wt Readings from Last 3 Encounters:  09/11/15 161 lb (73.029 kg)  09/09/15 162 lb 6.4 oz (73.664 kg)  02/17/15 156 lb 6 oz (70.931 kg)     Review of Systems Denies chest pain or difficulty breathing No nausea, vomiting, diarrhea No depression per se. No headaches.   Past Medical History  Diagnosis Date  . ADHD (attention deficit hyperactivity disorder)   . Acne   . Heart murmur 11/21/2011    h/o    Past Surgical History  Procedure Laterality Date  . Skin graft Left     ear    Social History   Social History  . Marital Status: Single    Spouse Name: n/a  . Number of Children: 0  . Years of Education: 12+   Occupational History  . Independent study for certification in networking    Social History Main Topics  . Smoking status: Former Games developermoker  . Smokeless tobacco: Never Used  . Alcohol Use: 0.0 oz/week    0 Standard drinks or equivalent per week     Comment: wine sometimes  . Drug Use: No  . Sexual Activity: Not on file   Other Topics Concern  . Not on file   Social History Narrative   Lives alone.   Family lives locally.        Medication List       This list is accurate as of: 09/09/15 11:59 PM.  Always use your most recent med list.               amphetamine-dextroamphetamine 20 MG 24 hr capsule  Commonly known as:  ADDERALL XR  Take 1-2 capsules (20-40 mg total) by mouth daily.     amphetamine-dextroamphetamine 20 MG 24 hr capsule  Commonly known as:  ADDERALL XR  Take 1-2 capsules (20-40 mg total) by mouth daily.     escitalopram 10 MG tablet  Commonly known as:  LEXAPRO  Take 1 tablet (10 mg total) by mouth daily.           Objective:   Physical Exam BP 132/84 mmHg  Pulse 80  Temp(Src) 98.1 F (36.7 C) (Oral)  Ht 5\' 8"  (1.727 m)  Wt 162 lb 6.4 oz (73.664 kg)  BMI 24.70 kg/m2  SpO2 98% General:   Well developed, well nourished . NAD.  HEENT:  Normocephalic . Face symmetric, atraumatic Neck: No thyromegaly Lungs:  CTA B Normal respiratory effort, no intercostal retractions, no accessory muscle use. Heart: RRR,  + systolic murmur.  No pretibial edema bilaterally  Skin: Not pale. Not jaundice Neurologic:  alert & oriented X3.  Speech normal, gait appropriate for age and unassisted Psych--  Cognition and judgment appear intact.  Cooperative with normal attention span and concentration.  Behavior appropriate. Slightly anxious but not depressed appearing  .    Assessment & Plan:   Assessment ADHD, UDS low risk 02-2015 VSD (small, membranous), last cards visit 2014, no SBE  prophylaxis per cards note 2014  Plan: ADHD: Well-controlled with Adderall, refill as needed Palpitations: as above, not related to Adderall (taking it for years). EKG normal sinus rhythm, no previous EKG. Although sx are likely due to anxiety, he has a history of VSD, overdue to see cards---->  Referral. Anxiety: Job-related, we talk about options including counseling and medication, he has already think about it and he feels he is ready for medicine. Recommend Lexapro, discussed side effects including suicidality. RTC 4 weeks,

## 2015-09-11 ENCOUNTER — Ambulatory Visit (INDEPENDENT_AMBULATORY_CARE_PROVIDER_SITE_OTHER): Payer: Managed Care, Other (non HMO) | Admitting: Physician Assistant

## 2015-09-11 VITALS — BP 110/70 | HR 87 | Temp 98.1°F | Resp 16 | Ht 68.0 in | Wt 161.0 lb

## 2015-09-11 DIAGNOSIS — J029 Acute pharyngitis, unspecified: Secondary | ICD-10-CM | POA: Diagnosis not present

## 2015-09-11 LAB — POCT RAPID STREP A (OFFICE): RAPID STREP A SCREEN: NEGATIVE

## 2015-09-11 MED ORDER — GUAIFENESIN ER 1200 MG PO TB12: 1.0000 | ORAL_TABLET | Freq: Two times a day (BID) | ORAL | Status: DC | PRN

## 2015-09-11 MED ORDER — AMOXICILLIN 875 MG PO TABS: 875.0000 mg | ORAL_TABLET | Freq: Two times a day (BID) | ORAL | Status: AC

## 2015-09-11 NOTE — Progress Notes (Signed)
Patient ID: JOSELITO FIELDHOUSE, male    DOB: Apr 20, 1995, 20 y.o.   MRN: [id number]  PCP: Willow Ora, MD  Subjective:   Chief Complaint  Patient presents with  . Sore Throat    x 2 days  . Ear Pain    left/ x2 days    HPI Presents for evaluation of sore throat and LEFT ear pain x 2-3 days. Hurts to swallow. Ear pain has lessened following a dose of Mucinex. Feels throat congestion, but none in the nose. No cough. No fever, chills. No GI symptoms. No unexplained myalgias or arthralgias.  He had similar symptoms 11/2014. RS was negative. He was started empirically on amoxicillin. When the TCx was also negative he was advised he could discontinue the antibiotic.  He was also seen here 06/2014 with sore throat and LEFT ear pain. He was treated for LEFT AOM and sinusitis with Augmentin.    Review of Systems  Constitutional: Negative for fever, chills, diaphoresis and fatigue.  HENT: Positive for ear pain, sore throat and trouble swallowing. Negative for congestion, postnasal drip, rhinorrhea, sinus pressure, tinnitus and voice change.   Eyes: Negative for pain and visual disturbance.  Respiratory: Negative for cough, choking and shortness of breath.   Cardiovascular: Negative for chest pain and palpitations.  Gastrointestinal: Negative for nausea, vomiting and diarrhea.  Musculoskeletal: Negative for myalgias, back pain and arthralgias.  Skin: Negative.   Neurological: Negative for dizziness and headaches.  Hematological: Negative for adenopathy.       Patient Active Problem List   Diagnosis Date Noted  . PCP NOTES >>>> 02/18/2015  . Corns/callosities 05/05/2014  . Annual physical exam 11/21/2011  . Heart murmur 11/21/2011  . ADHD (attention deficit hyperactivity disorder) 11/21/2011  . Acne 11/21/2011     Prior to Admission medications   Medication Sig Start Date End Date Taking? Authorizing Provider  amphetamine-dextroamphetamine (ADDERALL XR) 20 MG 24 hr  capsule Take 1-2 capsules (20-40 mg total) by mouth daily. 09/09/15  Yes Wanda Plump, MD  amphetamine-dextroamphetamine (ADDERALL XR) 20 MG 24 hr capsule Take 1-2 capsules (20-40 mg total) by mouth daily. 09/09/15  Yes Wanda Plump, MD  escitalopram (LEXAPRO) 10 MG tablet Take 1 tablet (10 mg total) by mouth daily. 09/09/15  Yes Wanda Plump, MD     Allergies  Allergen Reactions  . Sulfa Drugs Cross Reactors Nausea And Vomiting       Objective:  Physical Exam  Constitutional: He is oriented to person, place, and time. He appears well-developed and well-nourished. No distress.  BP 110/70 mmHg  Pulse 87  Temp(Src) 98.1 F (36.7 C) (Oral)  Resp 16  Ht  (1.727 m)  Wt 161 lb (73.029 kg)  BMI 24.49 kg/m2  SpO2 97%   HENT:  Head: Normocephalic and atraumatic.  Right Ear: Hearing, external ear and ear canal normal. Tympanic membrane is scarred.  Left Ear: Hearing, external ear and ear canal normal. Tympanic membrane is scarred. A middle ear effusion is present.  Nose: Mucosal edema and rhinorrhea present.  No foreign bodies. Right sinus exhibits no maxillary sinus tenderness and no frontal sinus tenderness. Left sinus exhibits no maxillary sinus tenderness and no frontal sinus tenderness.  Mouth/Throat: Uvula is midline and mucous membranes are normal. No uvula swelling. Posterior oropharyngeal erythema present. No oropharyngeal exudate or posterior oropharyngeal edema.  Eyes: Conjunctivae and EOM are normal. Pupils are equal, round, and reactive to light. Right eye exhibits no discharge. Left eye exhibits no  discharge. No scleral icterus.  Neck: Trachea normal, normal range of motion and full passive range of motion without pain. Neck supple. No thyroid mass and no thyromegaly present.  Cardiovascular: Normal rate, regular rhythm, S1 normal and S2 normal.   Murmur heard.  Systolic murmur is present with a grade of 3/6  Pulmonary/Chest: Effort normal and breath sounds normal.    Lymphadenopathy:       Head (right side): No submandibular, no tonsillar, no preauricular, no posterior auricular and no occipital adenopathy present.       Head (left side): No submandibular, no tonsillar, no preauricular and no occipital adenopathy present.    He has no cervical adenopathy.       Right: No supraclavicular adenopathy present.       Left: No supraclavicular adenopathy present.  Neurological: He is alert and oriented to person, place, and time. He has normal strength. No cranial nerve deficit or sensory deficit.  Skin: Skin is warm, dry and intact. No rash noted.  Psychiatric: He has a normal mood and affect. His speech is normal and behavior is normal.    Results for orders placed or performed in visit on 09/11/15  POCT rapid strep A  Result Value Ref Range   Rapid Strep A Screen Negative Negative          Assessment & Plan:   1. Sore throat Cover for possible strep pharyngitis with amox, pending TCx. Supportive care. Anticipatory guidance. - POCT rapid strep A - Culture, Group A Strep - amoxicillin (AMOXIL) 875 MG tablet; Take 1 tablet (875 mg total) by mouth 2 (two) times daily.  Dispense: 20 tablet; Refill: 0 - Guaifenesin (MUCINEX MAXIMUM STRENGTH) 1200 MG TB12; Take 1 tablet (1,200 mg total) by mouth every 12 (twelve) hours as needed.  Dispense: 14 tablet; Refill: 1   Fernande Brashelle S. Izeyah Deike, PA-C Physician Assistant-Certified [facility]

## 2015-09-11 NOTE — Patient Instructions (Addendum)
Get plenty of rest and drink at least 64 ounces of water daily.     IF you received an x-ray today, you will receive an invoice from [facility]. Please contact [facility] at [phone] with questions or concerns regarding your invoice.   IF you received labwork today, you will receive an invoice from [facility]. Please contact [facility] at [phone] with questions or concerns regarding your invoice.   Our billing staff will not be able to assist you with questions regarding bills from these companies.  You will be contacted with the lab results as soon as they are available. The fastest way to get your results is to activate your My Chart account. Instructions are located on the last page of this paperwork. If you have not heard from us regarding the results in 2 weeks, please contact this office.     

## 2015-09-13 LAB — CULTURE, GROUP A STREP: ORGANISM ID, BACTERIA: NORMAL

## 2015-09-16 NOTE — Assessment & Plan Note (Signed)
ADHD: Well-controlled with Adderall, refill as needed Palpitations: as above, not related to Adderall (taking it for years). EKG normal sinus rhythm, no previous EKG. Although sx are likely due to anxiety, he has a history of VSD, overdue to see cards---->  Referral. Anxiety: Job-related, we talk about options including counseling and medication, he has already think about it and he feels he is ready for medicine. Recommend Lexapro, discussed side effects including suicidality. RTC 4 weeks,

## 2015-10-14 ENCOUNTER — Encounter: Payer: Self-pay | Admitting: Internal Medicine

## 2015-10-14 ENCOUNTER — Ambulatory Visit (INDEPENDENT_AMBULATORY_CARE_PROVIDER_SITE_OTHER): Payer: Managed Care, Other (non HMO) | Admitting: Internal Medicine

## 2015-10-14 VITALS — BP 118/74 | HR 90 | Temp 98.1°F | Ht 68.0 in | Wt 168.2 lb

## 2015-10-14 DIAGNOSIS — F411 Generalized anxiety disorder: Secondary | ICD-10-CM | POA: Diagnosis not present

## 2015-10-14 MED ORDER — ESCITALOPRAM OXALATE 10 MG PO TABS: 10.0000 mg | ORAL_TABLET | Freq: Every day | ORAL | Status: DC

## 2015-10-14 NOTE — Patient Instructions (Signed)
   GO TO THE FRONT DESK Schedule your next appointment for a  Check up in 6 months, no fasting

## 2015-10-14 NOTE — Progress Notes (Signed)
   Subjective:    Patient ID: Theodore Williams, male    DOB: 09-Mar-1996, 20 y.o.   MRN: [id number]  DOS:  10/14/2015 Type of visit - description : Follow-up Interval history: Since the last office visit, he started Lexapro, he also change his job shift and that has decrease his stress. Overall feels improved, no further palpitations. Recently seen with a throat infection, improved.   Review of Systems Denies nausea, vomiting, diarrhea. ADD symptoms remain controlled. No palpitations No difficulty with his sleep Denies fever chills or sore throat  Past Medical History  Diagnosis Date  . ADHD (attention deficit hyperactivity disorder)   . Acne   . Heart murmur 11/21/2011    h/o    Past Surgical History  Procedure Laterality Date  . Skin graft Left     ear    Social History   Social History  . Marital Status: Single    Spouse Name: n/a  . Number of Children: 0  . Years of Education: 12+   Occupational History  . Independent study for certification in networking    Social History Main Topics  . Smoking status: Former Games developermoker  . Smokeless tobacco: Never Used  . Alcohol Use: 0.0 oz/week    0 Standard drinks or equivalent per week     Comment: wine sometimes  . Drug Use: No  . Sexual Activity: Not on file   Other Topics Concern  . Not on file   Social History Narrative   Lives alone.   Family lives locally.        Medication List       This list is accurate as of: 10/14/15  5:45 PM.  Always use your most recent med list.               amphetamine-dextroamphetamine 20 MG 24 hr capsule  Commonly known as:  ADDERALL XR  Take 1-2 capsules (20-40 mg total) by mouth daily.     amphetamine-dextroamphetamine 20 MG 24 hr capsule  Commonly known as:  ADDERALL XR  Take 1-2 capsules (20-40 mg total) by mouth daily.     escitalopram 10 MG tablet  Commonly known as:  LEXAPRO  Take 1 tablet (10 mg total) by mouth daily.           Objective:   Physical  Exam BP 118/74 mmHg  Pulse 90  Temp(Src) 98.1 F (36.7 C) (Oral)  Ht 5\' 8"  (1.727 m)  Wt 168 lb 4 oz (76.318 kg)  BMI 25.59 kg/m2  SpO2 95% General:   Well developed, well nourished . NAD.  HEENT:  Normocephalic . Face symmetric, atraumatic Skin: Not pale. Not jaundice Neurologic:  alert & oriented X3.  Speech normal, gait appropriate for age and unassisted Psych--  Cognition and judgment appear intact.  Cooperative with normal attention span and concentration.  Behavior appropriate. No anxious or depressed appearing.      Assessment & Plan:    Assessment ADHD, UDS low risk 02-2015 Satiety Rx Lexapro 08-2015 VSD (small, membranous), last cards visit 2014, no SBE prophylaxis per cards note 2014  PLAN: Anxiety: started Lexapro, improved , no s/e.  Patient thinks that part of the improvement is d/t decrease stress at work but nevertheless would like to continue with SSRIs for now. I agree, refill sent. Palpitations: Resolved RTC 6 months.

## 2015-10-14 NOTE — Assessment & Plan Note (Signed)
Anxiety: started Lexapro, improved , no s/e.  Patient thinks that part of the improvement is d/t decrease stress at work but nevertheless would like to continue with SSRIs for now. I agree, refill sent. Palpitations: Resolved RTC 6 months.

## 2015-10-14 NOTE — Progress Notes (Signed)
Pre visit review using our clinic review tool, if applicable. No additional management support is needed unless otherwise documented below in the visit note. 

## 2015-12-02 ENCOUNTER — Telehealth: Payer: Self-pay | Admitting: Internal Medicine

## 2015-12-02 MED ORDER — AMPHETAMINE-DEXTROAMPHET ER 20 MG PO CP24: 20.0000 mg | ORAL_CAPSULE | Freq: Every day | ORAL | 0 refills | Status: DC

## 2015-12-02 NOTE — Telephone Encounter (Signed)
Please inform Pt that Rx's have been placed at front desk for pick up at his convenience. Thank you.  

## 2015-12-02 NOTE — Telephone Encounter (Signed)
Okay 2 prescriptions 

## 2015-12-02 NOTE — Telephone Encounter (Signed)
Pt is requesting refill on Adderall.  Last OV: 09/09/2015 Last Fill: 09/09/2015 #60 and 0RF For May and June 2017 UDS: 02/26/2015 Low risk  Please advise.

## 2015-12-02 NOTE — Telephone Encounter (Signed)
Rx's for August and September 2017 printed, awaiting MD signature.  

## 2015-12-02 NOTE — Telephone Encounter (Signed)
Relationship to patient: self Can be reached: [phone]  Reason for call: Pt called for adderall refill. He has a couple left. Takes 1-2/day. Normally get 2 months at a time.

## 2015-12-03 NOTE — Telephone Encounter (Signed)
LM for pt

## 2016-01-12 ENCOUNTER — Encounter (HOSPITAL_COMMUNITY): Payer: Self-pay | Admitting: *Deleted

## 2016-01-12 ENCOUNTER — Encounter (HOSPITAL_COMMUNITY): Payer: Self-pay | Admitting: Emergency Medicine

## 2016-01-12 ENCOUNTER — Inpatient Hospital Stay (HOSPITAL_COMMUNITY)
Admission: AD | Admit: 2016-01-12 | Discharge: 2016-01-14 | DRG: 885 | Disposition: A | Discharge status: Home / Self Care | Payer: 59 | Source: Intra-hospital | Attending: Psychiatry | Admitting: Psychiatry

## 2016-01-12 ENCOUNTER — Emergency Department (HOSPITAL_COMMUNITY)
Admission: EM | Admit: 2016-01-12 | Discharge: 2016-01-12 | Disposition: A | Discharge status: Other Acute Inpatient Hospital | Payer: Managed Care, Other (non HMO) | Attending: Emergency Medicine | Admitting: Emergency Medicine

## 2016-01-12 DIAGNOSIS — Z882 Allergy status to sulfonamides status: Secondary | ICD-10-CM

## 2016-01-12 DIAGNOSIS — F322 Major depressive disorder, single episode, severe without psychotic features: Secondary | ICD-10-CM | POA: Diagnosis present

## 2016-01-12 DIAGNOSIS — Z8042 Family history of malignant neoplasm of prostate: Secondary | ICD-10-CM

## 2016-01-12 DIAGNOSIS — Y900 Blood alcohol level of less than 20 mg/100 ml: Secondary | ICD-10-CM | POA: Diagnosis present

## 2016-01-12 DIAGNOSIS — Z8249 Family history of ischemic heart disease and other diseases of the circulatory system: Secondary | ICD-10-CM

## 2016-01-12 DIAGNOSIS — F1021 Alcohol dependence, in remission: Secondary | ICD-10-CM | POA: Diagnosis present

## 2016-01-12 DIAGNOSIS — Z79899 Other long term (current) drug therapy: Secondary | ICD-10-CM

## 2016-01-12 DIAGNOSIS — F909 Attention-deficit hyperactivity disorder, unspecified type: Secondary | ICD-10-CM | POA: Insufficient documentation

## 2016-01-12 DIAGNOSIS — F329 Major depressive disorder, single episode, unspecified: Secondary | ICD-10-CM | POA: Diagnosis present

## 2016-01-12 DIAGNOSIS — Z791 Long term (current) use of non-steroidal anti-inflammatories (NSAID): Secondary | ICD-10-CM

## 2016-01-12 DIAGNOSIS — F4329 Adjustment disorder with other symptoms: Secondary | ICD-10-CM | POA: Diagnosis not present

## 2016-01-12 DIAGNOSIS — Z87891 Personal history of nicotine dependence: Secondary | ICD-10-CM | POA: Diagnosis not present

## 2016-01-12 DIAGNOSIS — G4700 Insomnia, unspecified: Secondary | ICD-10-CM | POA: Diagnosis present

## 2016-01-12 DIAGNOSIS — Z800 Family history of malignant neoplasm of digestive organs: Secondary | ICD-10-CM | POA: Diagnosis not present

## 2016-01-12 DIAGNOSIS — F411 Generalized anxiety disorder: Secondary | ICD-10-CM | POA: Diagnosis present

## 2016-01-12 DIAGNOSIS — Z823 Family history of stroke: Secondary | ICD-10-CM

## 2016-01-12 DIAGNOSIS — Z888 Allergy status to other drugs, medicaments and biological substances status: Secondary | ICD-10-CM | POA: Diagnosis not present

## 2016-01-12 DIAGNOSIS — R45851 Suicidal ideations: Secondary | ICD-10-CM

## 2016-01-12 LAB — CBC
HCT: 47.7 % (ref 39.0–52.0)
HEMOGLOBIN: 16.9 g/dL (ref 13.0–17.0)
MCH: 30.1 pg (ref 26.0–34.0)
MCHC: 35.4 g/dL (ref 30.0–36.0)
MCV: 84.9 fL (ref 78.0–100.0)
PLATELETS: 184 10*3/uL (ref 150–400)
RBC: 5.62 MIL/uL (ref 4.22–5.81)
RDW: 12.9 % (ref 11.5–15.5)
WBC: 8.4 10*3/uL (ref 4.0–10.5)

## 2016-01-12 LAB — COMPREHENSIVE METABOLIC PANEL
ALBUMIN: 4.2 g/dL (ref 3.5–5.0)
ALK PHOS: 83 U/L (ref 38–126)
ALT: 22 U/L (ref 17–63)
ANION GAP: 10 (ref 5–15)
AST: 21 U/L (ref 15–41)
BUN: 12 mg/dL (ref 6–20)
CALCIUM: 9.1 mg/dL (ref 8.9–10.3)
CO2: 26 mmol/L (ref 22–32)
CREATININE: 1.04 mg/dL (ref 0.61–1.24)
Chloride: 103 mmol/L (ref 101–111)
GFR calc Af Amer: >60 mL/min (ref >60)
GFR calc non Af Amer: >60 mL/min (ref >60)
GLUCOSE: 110 mg/dL — AB (ref 65–99)
Potassium: 3.6 mmol/L (ref 3.5–5.1)
SODIUM: 139 mmol/L (ref 135–145)
Total Bilirubin: 1 mg/dL (ref 0.3–1.2)
Total Protein: 7.3 g/dL (ref 6.5–8.1)

## 2016-01-12 LAB — SALICYLATE LEVEL

## 2016-01-12 LAB — RAPID URINE DRUG SCREEN, HOSP PERFORMED
AMPHETAMINES: POSITIVE — AB
BENZODIAZEPINES: NOT DETECTED
Barbiturates: NOT DETECTED
Cocaine: NOT DETECTED
OPIATES: NOT DETECTED
TETRAHYDROCANNABINOL: NOT DETECTED

## 2016-01-12 LAB — ETHANOL: Alcohol, Ethyl (B): <5 mg/dL (ref <5)

## 2016-01-12 LAB — ACETAMINOPHEN LEVEL: Acetaminophen (Tylenol), Serum: <10 ug/mL — ABNORMAL LOW (ref 10–30)

## 2016-01-12 MED ORDER — TRAZODONE HCL 50 MG PO TABS
50.0000 mg | ORAL_TABLET | Freq: Every day | ORAL | Status: DC
  Administered 2016-01-12 – 2016-01-13 (×2): 50 mg via ORAL
  Filled 2016-01-12 (×5): qty 1

## 2016-01-12 MED ORDER — ONDANSETRON HCL 4 MG PO TABS: 4.0000 mg | ORAL_TABLET | Freq: Three times a day (TID) | ORAL | Status: DC | PRN

## 2016-01-12 MED ORDER — ALUM & MAG HYDROXIDE-SIMETH 200-200-20 MG/5ML PO SUSP: 30.0000 mL | ORAL | Status: DC | PRN

## 2016-01-12 MED ORDER — ZOLPIDEM TARTRATE 10 MG PO TABS
10.0000 mg | ORAL_TABLET | Freq: Every evening | ORAL | Status: DC | PRN
Start: 2016-01-12 — End: 2016-01-12

## 2016-01-12 MED ORDER — TRAZODONE HCL 50 MG PO TABS: 50.0000 mg | ORAL_TABLET | Freq: Every day | ORAL | Status: DC

## 2016-01-12 MED ORDER — LORAZEPAM 1 MG PO TABS: 1.0000 mg | ORAL_TABLET | Freq: Three times a day (TID) | ORAL | Status: DC | PRN

## 2016-01-12 MED ORDER — ACETAMINOPHEN 325 MG PO TABS: 650.0000 mg | ORAL_TABLET | ORAL | Status: DC | PRN

## 2016-01-12 MED ORDER — NICOTINE 21 MG/24HR TD PT24: 21.0000 mg | MEDICATED_PATCH | Freq: Every day | TRANSDERMAL | Status: DC

## 2016-01-12 MED ORDER — HYDROXYZINE HCL 25 MG PO TABS: 25.0000 mg | ORAL_TABLET | Freq: Three times a day (TID) | ORAL | Status: DC | PRN

## 2016-01-12 MED ORDER — BUPROPION HCL ER (XL) 150 MG PO TB24
150.0000 mg | ORAL_TABLET | Freq: Every day | ORAL | Status: DC
  Administered 2016-01-13 – 2016-01-14 (×2): 150 mg via ORAL
  Filled 2016-01-12 (×4): qty 1

## 2016-01-12 MED ORDER — MAGNESIUM HYDROXIDE 400 MG/5ML PO SUSP: 30.0000 mL | Freq: Every day | ORAL | Status: DC | PRN

## 2016-01-12 MED ORDER — IBUPROFEN 600 MG PO TABS: 600.0000 mg | ORAL_TABLET | Freq: Three times a day (TID) | ORAL | Status: DC | PRN

## 2016-01-12 MED ORDER — NICOTINE 21 MG/24HR TD PT24
21.0000 mg | MEDICATED_PATCH | Freq: Every day | TRANSDERMAL | Status: DC
  Filled 2016-01-12: qty 1

## 2016-01-12 MED ORDER — IBUPROFEN 200 MG PO TABS: 600.0000 mg | ORAL_TABLET | Freq: Three times a day (TID) | ORAL | Status: DC | PRN

## 2016-01-12 MED ORDER — BUPROPION HCL ER (XL) 150 MG PO TB24
150.0000 mg | ORAL_TABLET | Freq: Every day | ORAL | Status: DC
  Administered 2016-01-12: 150 mg via ORAL
  Filled 2016-01-12: qty 1

## 2016-01-12 NOTE — Consult Note (Signed)
[facility] Consult   Reason for Consult:  Suicide threat Referring Physician:  EDP Patient Identification: Theodore Williams MRN:  [id number] Principal Diagnosis: Major depressive disorder, single episode, severe without psychosis ([place]) Diagnosis:   Patient Active Problem List   Diagnosis Date Noted  . Major depressive disorder, single episode, severe without psychosis ([place]) [F32.2] 01/12/2016    Priority: High  . PCP NOTES >>>> [Z09] 02/18/2015  . Corns/callosities [L84] 05/05/2014  . Annual physical exam [G53.64] 11/21/2011  . Heart murmur [R01.1] 11/21/2011  . ADHD (attention deficit hyperactivity disorder) [F90.9] 11/21/2011  . Acne [L70.9] 11/21/2011    Total Time spent with patient: 45 minutes  Subjective:   Theodore Williams is a 20 y.o. male patient admitted with suicide threat.  HPI:  On admission:  IVC states:  Danger to self. [name] has been diagnosed with ADHD and is on Adderall for this condition. [name] is currently very depressed and has expressed suicidal ideations. He has lost a job that has put him in a tailspin according to his father the petitioner. [name] has told his father, "Don't worry about seeing him again. When the respondent left his father's house he was having bad thoughts. He has discussed suicide before and has been sent several times to a phychologist. [name] has a shot gun and the petitioner is not sure where it is.   Patient states that he has been living with his parents due to recent stress at work and "to focus" and states that today when he got home his mother asked about his day and he discussed how he had a bad day due to getting bad news about his performance at work and being afraid of losing his job. He states that "my dad was like it could be worse and on and on and I didn't want to hear that and I compare myself to people who are doing better than me not people who are doing worse than me." Patient  states that his father asked a question and patient stated "I can't deal with this and put in my headphones because he says really mean things when he's mad." Patient states that his father "brought up past bills and told me to get my things and leave,i don't understand why he would say things like that,  and then I got into the car and he stood in the door and told me he was going to call the cops and I drove around to clear my head and then he was there when I got back to my apartment."   Patient denies SI and states that he has never told any one that he wanted to kill himself. Patient states that he told his family that "I had thoughts of it before I saw my psychiatrist a long time ago but I never said I wanted to kill myself." Patient denies self injurious behaviors. Patient denies feeling depressed at this time. Patient states that he has difficulty sleeping at time but denies other signs of depression Patient denies HI and history of aggression. Patient denies pending criminal charges and upcoming court dates. Patient states that he does have a rifle at home that he purchased for hunting. Patient states that he is not sure if the gun works.  Patient denies AVH and does not appear to be responding to internal stimuli during assessment.   Today, he has depressed mood and minimizes his actions despite saying he left his parents house twice when he knew they were  worried about him killing himself.  Stressed with job that he will most likely lose.  Denies homicidal ideations, hallucinations, and alcohol/drug abuse  Past Psychiatric History: none  Risk to Self: Suicidal Ideation: No Suicidal Intent: No Is patient at risk for suicide?: No Suicidal Plan?: No Access to Means: No What has been your use of drugs/alcohol within the last 12 months?: Denies How many times?: 0 Other Self Harm Risks: Denies Triggers for Past Attempts: None known Intentional Self Injurious Behavior: None Risk to Others:  Homicidal Ideation: No Thoughts of Harm to Others: No Current Homicidal Intent: No Current Homicidal Plan: No Access to Homicidal Means: No Identified Victim: Denies History of harm to others?: No Assessment of Violence: None Noted Violent Behavior Description: Denies Does patient have access to weapons?: Yes (Comment) (Rifle) Criminal Charges Pending?: No Does patient have a court date: No Prior Inpatient Therapy: Prior Inpatient Therapy: No Prior Therapy Dates: N/A Prior Therapy Facilty/Provider(s): N/A Reason for Treatment: N/A Prior Outpatient Therapy: Prior Outpatient Therapy: Yes Prior Therapy Dates: "about 15 months ago" Prior Therapy Facilty/Provider(s): [facility]product [facility]) Reason for Treatment: Depression  Does patient have an ACCT team?: No Does patient have Intensive In-House Services?  : No Does patient have [facility] services? : No Does patient have P4CC services?: No  Past Medical History:  Past Medical History:  Diagnosis Date  . Acne   . ADHD (attention deficit hyperactivity disorder)   . Heart murmur 11/21/2011   h/o    Past Surgical History:  Procedure Laterality Date  . SKIN GRAFT Left    ear   Family History:  Family History  Problem Relation Age of Onset  . Hyperlipidemia Father   . Hypertension Father   . Stroke Father   . Colon cancer Neg Hx   . Prostate cancer Neg Hx    Family Psychiatric  History: none Social History:  History  Alcohol Use  . 0.0 oz/week    Comment: wine sometimes     History  Drug Use No    Social History   Social History  . Marital status: Single    Spouse name: n/a  . Number of children: 0  . Years of education: 12+   Occupational History  . Independent study for certification in networking    Social History Main Topics  . Smoking status: Former Research scientist (life sciences)  . Smokeless tobacco: Never Used  . Alcohol use 0.0 oz/week     Comment: wine sometimes  . Drug use: No  . Sexual activity: Not Asked   Other  Topics Concern  . None   Social History Narrative   Lives alone.   Family lives locally.   Additional Social History:    Allergies:   Allergies  Allergen Reactions  . Sulfa Drugs Cross Reactors Nausea And Vomiting    Labs:  Results for orders placed or performed during the hospital encounter of 01/12/16 (from the past 48 hour(s))  Rapid urine drug screen (hospital performed)     Status: Abnormal   Collection Time: 01/12/16  2:38 AM  Result Value Ref Range   Opiates NONE DETECTED NONE DETECTED   Cocaine NONE DETECTED NONE DETECTED   Benzodiazepines NONE DETECTED NONE DETECTED   Amphetamines POSITIVE (A) NONE DETECTED   Tetrahydrocannabinol NONE DETECTED NONE DETECTED   Barbiturates NONE DETECTED NONE DETECTED    Comment:        DRUG SCREEN FOR MEDICAL PURPOSES ONLY.  IF CONFIRMATION IS NEEDED FOR ANY PURPOSE, NOTIFY LAB WITHIN 5  DAYS.        LOWEST DETECTABLE LIMITS FOR URINE DRUG SCREEN Drug Class       Cutoff (ng/mL) Amphetamine      1000 Barbiturate      200 Benzodiazepine   270 Tricyclics       623 Opiates          300 Cocaine          300 THC              50   Comprehensive metabolic panel     Status: Abnormal   Collection Time: 01/12/16  2:45 AM  Result Value Ref Range   Sodium 139 135 - 145 mmol/L   Potassium 3.6 3.5 - 5.1 mmol/L   Chloride 103 101 - 111 mmol/L   CO2 26 22 - 32 mmol/L   Glucose, Bld 110 (H) 65 - 99 mg/dL   BUN 12 6 - 20 mg/dL   Creatinine, Ser 1.04 0.61 - 1.24 mg/dL   Calcium 9.1 8.9 - 10.3 mg/dL   Total Protein 7.3 6.5 - 8.1 g/dL   Albumin 4.2 3.5 - 5.0 g/dL   AST 21 15 - 41 U/L   ALT 22 17 - 63 U/L   Alkaline Phosphatase 83 38 - 126 U/L   Total Bilirubin 1.0 0.3 - 1.2 mg/dL   GFR calc non Af Williams >60 >60 mL/min   GFR calc Af Williams >60 >60 mL/min    Comment: (NOTE) The eGFR has been calculated using the CKD EPI equation. This calculation has not been validated in all clinical situations. eGFR's persistently <60 mL/min signify  possible Chronic Kidney Disease.    Anion gap 10 5 - 15  Ethanol     Status: None   Collection Time: 01/12/16  2:45 AM  Result Value Ref Range   Alcohol, Ethyl (B) <5 <5 mg/dL    Comment:        LOWEST DETECTABLE LIMIT FOR SERUM ALCOHOL IS 5 mg/dL FOR MEDICAL PURPOSES ONLY   Salicylate level     Status: None   Collection Time: 01/12/16  2:45 AM  Result Value Ref Range   Salicylate Lvl <7.6 2.8 - 30.0 mg/dL  Acetaminophen level     Status: Abnormal   Collection Time: 01/12/16  2:45 AM  Result Value Ref Range   Acetaminophen (Tylenol), Serum <10 (L) 10 - 30 ug/mL    Comment:        THERAPEUTIC CONCENTRATIONS VARY SIGNIFICANTLY. A RANGE OF 10-30 ug/mL MAY BE AN EFFECTIVE CONCENTRATION FOR MANY PATIENTS. HOWEVER, SOME ARE BEST TREATED AT CONCENTRATIONS OUTSIDE THIS RANGE. ACETAMINOPHEN CONCENTRATIONS >150 ug/mL AT 4 HOURS AFTER INGESTION AND >50 ug/mL AT 12 HOURS AFTER INGESTION ARE OFTEN ASSOCIATED WITH TOXIC REACTIONS.   cbc     Status: None   Collection Time: 01/12/16  2:45 AM  Result Value Ref Range   WBC 8.4 4.0 - 10.5 K/uL   RBC 5.62 4.22 - 5.81 MIL/uL   Hemoglobin 16.9 13.0 - 17.0 g/dL   HCT 47.7 39.0 - 52.0 %   MCV 84.9 78.0 - 100.0 fL   MCH 30.1 26.0 - 34.0 pg   MCHC 35.4 30.0 - 36.0 g/dL   RDW 12.9 11.5 - 15.5 %   Platelets 184 150 - 400 K/uL    Current Facility-Administered Medications  Medication Dose Route Frequency Provider Last Rate Last Dose  . acetaminophen (TYLENOL) tablet 650 mg  650 mg Oral Q4H PRN Rolland Porter, MD      .  alum & mag hydroxide-simeth (MAALOX/MYLANTA) 200-200-20 MG/5ML suspension 30 mL  30 mL Oral PRN Rolland Porter, MD      . ibuprofen (ADVIL,MOTRIN) tablet 600 mg  600 mg Oral Q8H PRN Rolland Porter, MD      . LORazepam (ATIVAN) tablet 1 mg  1 mg Oral Q8H PRN Rolland Porter, MD      . nicotine (NICODERM CQ - dosed in mg/24 hours) patch 21 mg  21 mg Transdermal Daily Rolland Porter, MD      . ondansetron (ZOFRAN) tablet 4 mg  4 mg Oral Q8H PRN Rolland Porter, MD      . zolpidem (AMBIEN) tablet 10 mg  10 mg Oral QHS PRN Rolland Porter, MD       Current Outpatient Prescriptions  Medication Sig Dispense Refill  . amphetamine-dextroamphetamine (ADDERALL XR) 20 MG 24 hr capsule Take 1-2 capsules (20-40 mg total) by mouth daily. 60 capsule 0  . escitalopram (LEXAPRO) 10 MG tablet Take 1 tablet (10 mg total) by mouth daily. 30 tablet 6  . OVER THE COUNTER MEDICATION Take 1 scoop by mouth daily as needed (before workout). Pre-workout Powder    . amphetamine-dextroamphetamine (ADDERALL XR) 20 MG 24 hr capsule Take 1-2 capsules (20-40 mg total) by mouth daily. (Patient not taking: Reported on 01/12/2016) 60 capsule 0    Musculoskeletal: Strength & Muscle Tone: within normal limits Gait & Station: normal Patient leans: N/A  Psychiatric Specialty Exam: Physical Exam  Constitutional: He is oriented to person, place, and time. He appears well-developed and well-nourished.  HENT:  Head: Normocephalic.  Neck: Normal range of motion.  Respiratory: Effort normal.  Musculoskeletal: Normal range of motion.  Neurological: He is alert and oriented to person, place, and time.  Skin: Skin is warm and dry.  Psychiatric: His speech is normal and behavior is normal. Judgment normal. Cognition and memory are normal. He exhibits a depressed mood. He expresses suicidal ideation. He expresses suicidal plans.    Review of Systems  Constitutional: Negative.   HENT: Negative.   Eyes: Negative.   Respiratory: Negative.   Cardiovascular: Negative.   Gastrointestinal: Negative.   Genitourinary: Negative.   Musculoskeletal: Negative.   Skin: Negative.   Neurological: Negative.   Endo/Heme/Allergies: Negative.   Psychiatric/Behavioral: Positive for depression and suicidal ideas.    Blood pressure 132/82, pulse 107, temperature 97.8 F (36.6 C), temperature source Oral, resp. rate 16, SpO2 98 %.There is no height or weight on file to calculate BMI.  General  Appearance: Disheveled  Eye Contact:  Fair  Speech:  Normal Rate  Volume:  Normal  Mood:  Anxious and Depressed  Affect:  Congruent  Thought Process:  Coherent and Descriptions of Associations: Intact  Orientation:  Full (Time, Place, and Person)  Thought Content:  Rumination  Suicidal Thoughts:  Yes.  with intent/plan  Homicidal Thoughts:  No  Memory:  Immediate;   Fair Recent;   Fair Remote;   Fair  Judgement:  Impaired  Insight:  Lacking  Psychomotor Activity:  Decreased  Concentration:  Concentration: Fair and Attention Span: Fair  Recall:  [employer] of Knowledge:  Fair  Language:  Good  Akathisia:  No  Handed:  Right  AIMS (if indicated):     Assets:  Housing Leisure Time Physical Health Resilience Social Support  ADL's:  Intact  Cognition:  WNL  Sleep:        Treatment Plan Summary: Daily contact with patient to assess and evaluate symptoms and progress in  treatment, Medication management and Plan major depressive disorder, single episode, severe without psychosis:  -Crisis stabilization -Medication management:  Start Wellbutrin 150 mg daily for depression and Vistaril 25 mg TID PRN anxiety, Trazodone 50 mg at bedtime for sleep -Individual counseling  Disposition: Recommend psychiatric Inpatient admission when medically cleared.  Waylan Boga, NP 01/12/2016 11:25 AM  Patient seen face-to-face for psychiatric evaluation, chart reviewed and case discussed with the physician extender and developed treatment plan. Reviewed the information documented and agree with the treatment plan. Corena Pilgrim, MD

## 2016-01-12 NOTE — [facility] Group Notes (Signed)
Adult Psychoeducational Group Note  Date:  01/12/2016 Time:  9:07 PM  Group Topic/Focus:  Wrap-Up Group:   The focus of this group is to help patients review their daily goal of treatment and discuss progress on daily workbooks.   Participation Level:  Active  Participation Quality:  Appropriate  Affect:  Appropriate  Cognitive:  Appropriate  Insight: Appropriate  Engagement in Group:  Engaged  Modes of Intervention:  Discussion  Additional Comments:  Pt stated that his goal was to be less anxious. He rated his day an 8 out of ten. Pt visited with his brother today. [name], [name] 01/12/2016, 9:07 PM

## 2016-01-12 NOTE — ED Triage Notes (Signed)
Pt brought in by [employer] under IVC  Per papers pt has been diagnosed with ADHD and is on adderall for this condition  Pt is currently very depressed and has expressed suicidal ideations  Pt recently lost his job Pt has a shot gun and his father is not sure where it is  Pt left home tonight and was upset  Father took out paperwork

## 2016-01-12 NOTE — BH Assessment (Signed)
[facility] Assessment Progress Note  Per Thedore MinsMojeed Akintayo, MD, this pt requires psychiatric hospitalization at this time.  Lillia AbedLindsay, RN, [id number]; they will be ready to receive pt at 13:00.  Pt presents under IVC which Dr Jannifer FranklinAkintayo has upheld, and [facility]  Pt's nurse has been notified, and agrees to call report to [phone].  Pt is to be transported via [employer].  Doylene Canninghomas Law Corsino, MA Triage Specialist [phone]

## 2016-01-12 NOTE — ED Notes (Signed)
Patient visiting with brother.  Mom called to check on her son.  Nurse took her number and will try to get patient to call her.

## 2016-01-12 NOTE — ED Notes (Signed)
[facility], [place].
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
## 2016-01-12 NOTE — ED Notes (Signed)
Patient talking to nurse about why he is in the hospital.  Nurse explained that he was sent here because his family was worried about him and that he is not here voluntarily and cannot leave until the doctor feels he is not a danger to himself.  Patient related how he has been depressed lately and shared that with his father, but did not report to nurse that he was suicidal.  He said he had driven to [place] to "clear his head," and that his father is always controlling and gets all worked up.  He said his parents have been under a lot of stress with caring for his mother's elderly relatives. He is upset about having to stay because he says he will lose his job and have to move in with his parents again.   He said his Dad threw away his acne medication and told his son, "you have your way of dealing with things and I have mine."  Patient calm while talking to nurse and accepted a visit from his brother, but did not want to see his father.

## 2016-01-12 NOTE — Tx Team (Signed)
Initial Treatment Plan 01/12/2016 4:29 PM Matilde Haymakerathaniel R Vivanco ZOX:[id number]    PATIENT STRESSORS: Marital or family conflict Occupational concerns   PATIENT STRENGTHS: Capable of independent living Psychologist, counsellingCommunication skills Financial means Physical Health Supportive family/friends   PATIENT IDENTIFIED PROBLEMS: Anxiety  Depression  "To get out of here"  "Fix job issue"               DISCHARGE CRITERIA:  Ability to meet basic life and health needs Improved stabilization in mood, thinking, and/or behavior Motivation to continue treatment in a less acute level of care Need for constant or close observation no longer present  PRELIMINARY DISCHARGE PLAN: Outpatient therapy Return to previous living arrangement Return to previous work or school arrangements  PATIENT/FAMILY INVOLVEMENT: This treatment plan has been presented to and reviewed with the patient, [name].  The patient and family have been given the opportunity to ask questions and make suggestions.  Carleene OverlieMiddleton, Jalaina Salyers P, RN 01/12/2016, 4:29 PM

## 2016-01-12 NOTE — BH Assessment (Signed)
Assessment completed. Consulted with Donell SievertSpencer Simon, PA-C who recommends overnight observation and evaluation by psychiatry for final disposition.   Davina PokeJoVea Caoilainn Sacks, LCSW Therapeutic Triage Specialist [facility] 01/12/2016 6:26 AM

## 2016-01-12 NOTE — ED Notes (Signed)
Father:  [phone]

## 2016-01-12 NOTE — Progress Notes (Signed)
Patient ID: Theodore Williams, male   DOB: 09/08/1995, 20 y.o.   MRN: [id number] D: Client reports stressor as returning to parents house "I'm not going back to my parents house" Client seen dayroom playing cards. A: Writer provided emotional support, encouraged to consider a plan for independent living. Medication reviewed and administered as ordered. Staff will monitor q7215min for safety. R: Client is safe on the unit, attended group.
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
## 2016-01-13 DIAGNOSIS — Z8042 Family history of malignant neoplasm of prostate: Secondary | ICD-10-CM

## 2016-01-13 DIAGNOSIS — Z79899 Other long term (current) drug therapy: Secondary | ICD-10-CM

## 2016-01-13 DIAGNOSIS — Z888 Allergy status to other drugs, medicaments and biological substances status: Secondary | ICD-10-CM

## 2016-01-13 DIAGNOSIS — F322 Major depressive disorder, single episode, severe without psychotic features: Principal | ICD-10-CM

## 2016-01-13 DIAGNOSIS — Z800 Family history of malignant neoplasm of digestive organs: Secondary | ICD-10-CM

## 2016-01-13 DIAGNOSIS — Z823 Family history of stroke: Secondary | ICD-10-CM

## 2016-01-13 DIAGNOSIS — Z8249 Family history of ischemic heart disease and other diseases of the circulatory system: Secondary | ICD-10-CM

## 2016-01-13 NOTE — [facility] Group Notes (Signed)
Pt attended group. Pt stated he is feeling less anxious. Anxiety level is at zero. He feels awesome.

## 2016-01-13 NOTE — Progress Notes (Signed)
D: Pt. is up and visible in the milieu, interacting well with peers. Denies having any SI/HI/AVH/Pain at this time. Pt. presents with a animated and bright affect and mood. Pt. states "I am excited for D/C". Pt. is pleasant and cooperative. Pt. attended wrap up group and had visitors earlier this evening.  A: Encouragement and support given. Meds ordered and given.   R: Safety maintained with Q 15 checks. Continues to follow treatment plan and will monitor closely. No questions/concerns at this time.

## 2016-01-13 NOTE — Tx Team (Signed)
Interdisciplinary Treatment and Diagnostic Plan Update  01/13/2016 Time of Session: 9:48 AM  Theodore Williams MRN: [id number]  Principal Diagnosis: <principal problem not specified>  Secondary Diagnoses: Active Problems:   Major depressive disorder, single episode, severe without psychotic features (HCC)   Current Medications:  Current Facility-Administered Medications  Medication Dose Route Frequency Provider Last Rate Last Dose  . acetaminophen (TYLENOL) tablet 650 mg  650 mg Oral Q4H PRN Charm Rings, NP      . alum & mag hydroxide-simeth (MAALOX/MYLANTA) 200-200-20 MG/5ML suspension 30 mL  30 mL Oral PRN Charm Rings, NP      . buPROPion (WELLBUTRIN XL) 24 hr tablet 150 mg  150 mg Oral Daily Charm Rings, NP   150 mg at 01/13/16 0830  . hydrOXYzine (ATARAX/VISTARIL) tablet 25 mg  25 mg Oral TID PRN Charm Rings, NP      . ibuprofen (ADVIL,MOTRIN) tablet 600 mg  600 mg Oral Q8H PRN Charm Rings, NP      . magnesium hydroxide (MILK OF MAGNESIA) suspension 30 mL  30 mL Oral Daily PRN Charm Rings, NP      . ondansetron [facility]) tablet 4 mg  4 mg Oral Q8H PRN Charm Rings, NP      . traZODone (DESYREL) tablet 50 mg  50 mg Oral QHS Charm Rings, NP   50 mg at 01/12/16 2205   PTA Medications: Prescriptions Prior to Admission  Medication Sig Dispense Refill Last Dose  . amphetamine-dextroamphetamine (ADDERALL XR) 20 MG 24 hr capsule Take 1-2 capsules (20-40 mg total) by mouth daily. (Patient not taking: Reported on 01/12/2016) 60 capsule 0 Not Taking at Unknown time  . amphetamine-dextroamphetamine (ADDERALL XR) 20 MG 24 hr capsule Take 1-2 capsules (20-40 mg total) by mouth daily. 60 capsule 0 01/11/2016 at 1800  . escitalopram (LEXAPRO) 10 MG tablet Take 1 tablet (10 mg total) by mouth daily. 30 tablet 6 ~2 months ago  . OVER THE COUNTER MEDICATION Take 1 scoop by mouth daily as needed (before workout). 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   Physician Treatment Plan for Primary Diagnosis: <principal problem not specified> Long Term Goal(s): Improvement in symptoms so as ready for discharge Improvement in symptoms so as ready for discharge   Short Term Goals: Ability to identify changes in lifestyle to reduce recurrence of condition will improve Ability to verbalize feelings will improve Ability to disclose and discuss suicidal ideas Ability to demonstrate self-control will improve Ability to identify and develop effective coping behaviors will improve Ability to maintain clinical measurements within normal limits will improve Compliance with prescribed medications will improve Ability to identify triggers associated with substance abuse/mental health issues will improve Ability to identify changes in lifestyle to reduce recurrence of condition will improve Ability to verbalize feelings will improve Ability to disclose and discuss suicidal ideas Ability to demonstrate self-control will improve Ability to identify and develop effective coping behaviors will improve Ability to maintain clinical measurements within normal limits will improve Compliance with prescribed medications will improve Ability to identify triggers associated with substance abuse/mental health issues will improve  Medication Management: Evaluate patient's response, side effects, and tolerance of medication regimen.  Therapeutic Interventions: 1  to 1 sessions, Unit Group sessions and Medication administration.  Evaluation of Outcomes: Progressing  Physician Treatment Plan for Secondary Diagnosis: Active Problems:   Major  depressive disorder, single episode, severe without psychotic features (HCC)  Long Term Goal(s): Improvement in symptoms so as ready for discharge Improvement in symptoms so as ready for discharge   Short Term Goals: Ability to identify changes in lifestyle to reduce recurrence of condition will improve Ability to verbalize feelings will improve Ability to disclose and discuss suicidal ideas Ability to demonstrate self-control will improve Ability to identify and develop effective coping behaviors will improve Ability to maintain clinical measurements within normal limits will improve Compliance with prescribed medications will improve Ability to identify triggers associated with substance abuse/mental health issues will improve Ability to identify changes in lifestyle to reduce recurrence of condition will improve Ability to verbalize feelings will improve Ability to disclose and discuss suicidal ideas Ability to demonstrate self-control will improve Ability to identify and develop effective coping behaviors will improve Ability to maintain clinical measurements within normal limits will improve Compliance with prescribed medications will improve Ability to identify triggers associated with substance abuse/mental health issues will improve     Medication Management: Evaluate patient's response, side effects, and tolerance of medication regimen.  Therapeutic Interventions: 1 to 1 sessions, Unit Group sessions and Medication administration.  Evaluation of Outcomes: Progressing   RN Treatment Plan for Primary Diagnosis: <principal problem not specified> Long Term Goal(s): Knowledge of disease and therapeutic regimen to maintain health will improve  Short Term Goals: Ability to remain free from injury will improve and Ability to verbalize feelings will improve  Medication Management: RN will administer medications as ordered by provider, will assess and evaluate patient's response and  provide education to patient for prescribed medication. RN will report any adverse and/or side effects to prescribing provider.  Therapeutic Interventions: 1 on 1 counseling sessions, Psychoeducation, Medication administration, Evaluate responses to treatment, Monitor vital signs and CBGs as ordered, Perform/monitor CIWA, COWS, AIMS and Fall Risk screenings as ordered, Perform wound care treatments as ordered.  Evaluation of Outcomes: Progressing   LCSW Treatment Plan for Primary Diagnosis: <principal problem not specified> Long Term Goal(s): Safe transition to appropriate next level of care at discharge, Engage patient in therapeutic group addressing interpersonal concerns.  Short Term Goals: Engage patient in aftercare planning with referrals and resources and Increase emotional regulation  Therapeutic Interventions: Assess for all discharge needs, 1 to 1 time with Social worker, Explore available resources and support systems, Assess for adequacy in community support network, Educate family and significant other(s) on suicide prevention, Complete Psychosocial Assessment, Interpersonal group therapy.  Evaluation of Outcomes: Progressing   Progress in Treatment: Attending groups: Yes. Participating in groups: Yes. Taking medication as prescribed: Yes. Toleration medication: Yes. Family/Significant other contact made: No, will contact:  working to reach family Patient understands diagnosis: Yes. Discussing patient identified problems/goals with staff: Yes. Medical problems stabilized or resolved: Yes. Denies suicidal/homicidal ideation: Yes. Issues/concerns per patient self-inventory: Yes. Other: NA  New problem(s) identified: No, Describe:  none  New Short Term/Long Term Goal(s):  Discharge Plan or Barriers:   Reason for Continuation of Hospitalization: Anxiety Depression  Estimated Length of Stay: 3-5 days  Attendees: Patient: Theodore Williams, Theodore Williams 01/13/2016 9:48 AM  Physician:  Jama Flavorsobos 01/13/2016 9:48 AM  Nursing:  01/13/2016 9:48 AM  RN Care Manager: Victorino DikeJennifer 01/13/2016 9:48 AM  Social Worker: Chad CordialLauren Carter 01/13/2016 9:48 AM  Recreational Therapist:  01/13/2016 9:48 AM  Other:  01/13/2016  9:48 AM  Other:  01/13/2016 9:48 AM  Other: 01/13/2016 9:48 AM    Scribe for Treatment Team: Raye Sorrow, LCSW 01/13/2016 9:48 AM
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
## 2016-01-13 NOTE — Plan of Care (Signed)
Problem: Safety: Goal: Periods of time without injury will increase Outcome: Progressing Pt. remains a low fall risk, denies SI/HI at this time, Q 15 checks in place for safety.    

## 2016-01-13 NOTE — B[facility] Counselor (Signed)
Adult Comprehensive Assessment  Patient ID: Theodore Williams, male   DOB: 28-Jul-1995, 20 y.o.   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 Family Relationships: Reports his father can be verbally mean and tells him things like get your things and leave Housing / Lack of housing: Fears losing his job and having to permantly move in with his parents  Living/Environment/Situation:  Living Arrangements: Parent Living conditions (as described by patient or guardian): Patient reports he lives with his parents and at times it can be hostile with his father.  Parents are supportive How long has patient lived in current situation?: last few weeks.  Patient has lived in an apartment as well What is atmosphere in current home: Loving, Supportive, Temporary  Family History:  Marital status: Single Does patient have children?: No  Childhood History:  By whom was/is the patient raised?: Both parents Did patient suffer from severe childhood neglect?: No Has patient ever been sexually abused/assaulted/raped as an adolescent or adult?: No Was the patient ever a victim of a crime or a disaster?: No Witnessed domestic violence?: No Has patient been effected by domestic violence as an adult?: No  Education:  Highest grade of school patient has completed: [employer] Currently a student?: Yes If yes, how has current illness impacted academic performance: Patient will begin school next week Contact person: NA How long has the patient attended?: [employer] Learning disability?: No  Employment/Work Situation:   Employment situation: Employed Where is patient currently employed?: [employer] How long has patient been employed?: less than a year Patient's job has been impacted by  current illness: Yes Describe how patient's job has been impacted: increased anxiety with performance based ratings that he recieves from his callers.  Fears he will lose his job due to not keeping up What is the longest time patient has a held a job?: current job Has patient ever been in the Eli Lilly and Companymilitary?: No Has patient ever served in combat?: No Did You Receive Any Psychiatric Treatment/Services While in Equities traderthe Military?: No Are There Guns or Other Weapons in Your Home?: Yes Types of Guns/Weapons: gun in home Are These Weapons Safely Secured?: No (patient reports it does not work and it is in his room) Who Could Verify You Are Able To Have These Secured:: His father  Surveyor, quantityinancial Resources:   Financial resources: Income from employment, Support from parents / caregiver Does patient have a Lawyerrepresentative payee or guardian?: No  Alcohol/Substance Abuse:   What has been your use of drugs/alcohol within the last 12 months?: Denies SA If attempted suicide, did drugs/alcohol play a role in this?: No Alcohol/Substance Abuse Treatment Hx: Denies past history Has alcohol/substance abuse ever caused legal problems?: No  Social Support System:   Conservation officer, natureatient's Community Support System: Good Describe Community Support System: parents, friends, other family Type of faith/religion: denies How does patient's faith help to cope with current illness?: NA  Leisure/Recreation:   Leisure and Hobbies: Enjoys working out  Strengths/Needs:      Discharge Plan:   Does patient have access to transportation?: Yes Will patient be returning to same living situation after discharge?: Yes Currently receiving community mental health services: No If no, would patient like referral for services when discharged?: Yes (What county?) (may resume care with previous  therapist) Does patient have financial barriers related to discharge medications?: No  Summary/Recommendations:   Summary and Recommendations (to be completed by  the evaluator): [name] is currently very depressed and has expressed suicidal ideations. He has lost a job that has put him in a tailspin according to his father the petitioner. [name] has told his father, "Don't worry about seeing him again. When the respondent left his father's house he was having bad thoughts. He has discussed suicide before and has been sent several times to a phychologist. [name] has a shot gun and the petitioner is not sure where it is.   Patient would benefit from crisis stablization, aftercare planning, medication trial and group therapy to address increased anxiety and hx of ADHD.  Theodore Williams 01/13/2016

## 2016-01-13 NOTE — [facility] Suicide Risk Assessment (Signed)
[facility]   Nursing information obtained from:  Patient Demographic factors:  Male, Adolescent or young adult, Caucasian, Living alone, Access to firearms Current Mental Status:  NA Loss Factors:  NA Historical Factors:  NA Risk Reduction Factors:  Employed, Positive social support  Total Time spent with patient: 1 hour Principal Problem: <principal problem not specified> Diagnosis:   Patient Active Problem List   Diagnosis Date Noted  . Major depressive disorder, single episode, severe without psychosis (HCC) [F32.2] 01/12/2016  . Major depressive disorder, single episode, severe without psychotic features (HCC) [F32.2] 01/12/2016  . PCP NOTES >>>> [Z09] 02/18/2015  . Corns/callosities [L84] 05/05/2014  . Annual physical exam [Z00.00] 11/21/2011  . Heart murmur [R01.1] 11/21/2011  . ADHD (attention deficit hyperactivity disorder) [F90.9] 11/21/2011  . Acne [L70.9] 11/21/2011   Subjective Data: See admision assessment  Continued Clinical Symptoms:  Alcohol Use Disorder Identification Test Final Score (AUDIT): 0 The "Alcohol Use Disorders Identification Test", Guidelines for Use in Primary Care, Second Edition.  World Science writerHealth Organization [facility](WHO). Score between 0-7:  no or low risk or alcohol related problems. Score between 8-15:  moderate risk of alcohol related problems. Score between 16-19:  high risk of alcohol related problems. Score 20 or above:  warrants further diagnostic evaluation for alcohol dependence and treatment.   CLINICAL FACTORS:   Depression:   Impulsivity Insomnia   Musculoskeletal: Strength & Muscle Tone: See admission Assessment Psychiatric Specialty Exam: See Admission Assessment Physical Exam  ROS  Blood pressure 105/64, pulse (!) 109, temperature 98.6 F (37 C), temperature source Oral, resp. rate (!) 21, height 5\' 8"  (1.727 m), weight 72.6 kg (160 lb).Body mass index is 24.33 kg/m.      COGNITIVE FEATURES THAT  CONTRIBUTE TO RISK:  None    SUICIDE RISK:   Moderate:  Frequent suicidal ideation with limited intensity, and duration, some specificity in terms of plans, no associated intent, good self-control, limited dysphoria/symptomatology, some risk factors present, and identifiable protective factors, including available and accessible social support.   PLAN OF CARE: See Admission Assessment  I certify that inpatient services furnished can reasonably be expected to improve the patient's condition.  Wynelle BourgeoisUreh N Lekauwa, MD 01/13/2016, 9:05 AM

## 2016-01-13 NOTE — [facility] Group Notes (Signed)
[facility] LCSW Group Therapy 01/13/2016 1:15 PM  Type of Therapy: Group Therapy- Emotion Regulation  Participation Level: Active   Participation Quality:  Appropriate  Affect: Appropriate  Cognitive: Alert and Oriented   Insight:  Developing/Improving  Engagement in Therapy: Developing/Improving and Engaged   Modes of Intervention: Clarification, Confrontation, Discussion, Education, Exploration, Limit-setting, Orientation, Problem-solving, Rapport Building, Dance movement psychotherapisteality Testing, Socialization and Support  Summary of Progress/Problems: The topic for group today was emotional regulation. This group focused on both positive and negative emotion identification and allowed group members to process ways to identify feelings, regulate negative emotions, and find healthy ways to manage internal/external emotions. Group members were asked to reflect on a time when their reaction to an emotion led to a negative outcome and explored how alternative responses using emotion regulation would have benefited them. Group members were also asked to discuss a time when emotion regulation was utilized when a negative emotion was experienced. Pt was receptive to emotion regulation handout and was willing to identify areas that he needs to change at discharge in order to more effectively manage his emotions.   Theodore CordialLauren Carter, LCSWA 01/13/2016 3:23 PM

## 2016-01-13 NOTE — Progress Notes (Signed)
D: Pt presents with flat affect and depressed mood. Pt minimal and appears to be minimizing his symptoms today. Pt guarded and cautious when forwarding information during shift assessment. Pt reports no depression or anxiety. Pt denies suicidal thoughts. Pt compliant with tx. No side effects to meds verbalized by pt.  A: Medications reviewed with pt. Medications administered as ordered per MD. Verbal support provided. Pt encouraged to attend groups. 15 minute checks performed for safety.  R: Pt receptive.

## 2016-01-13 NOTE — [facility] Suicide Risk Assessment (Signed)
[facility] INPATIENT:  Family/Significant Other Suicide Prevention Education  Suicide Prevention Education:  Education Completed; Will Laveda Normanhrasher, Pt's brother [phone],  has been identified by the patient as the family member/significant other with whom the patient will be residing, and identified as the person(s) who will aid the patient in the event of a mental health crisis (suicidal ideations/suicide attempt).  With written consent from the patient, the family member/significant other has been provided the following suicide prevention education, prior to the and/or following the discharge of the patient.  The suicide prevention education provided includes the following:  Suicide risk factors  Suicide prevention and interventions  National Suicide Hotline telephone number  [facility] assessment telephone number  [facility] Assistance 911  [facility] and/or [facility]idential [facility] Crisis [facility] telephone number  Request made of family/significant other to:  Remove weapons (e.g., guns, rifles, knives), all items previously/currently identified as safety concern.    Remove drugs/medications (over-the-counter, prescriptions, illicit drugs), all items previously/currently identified as a safety concern.  The family member/significant other verbalizes understanding of the suicide prevention education information provided.  The family member/significant other agrees to remove the items of safety concern listed above.  Pt's brother is willing to remove firearms from Pt's residence and store them in a safe place.  Elaina HoopsLauren M Carter 01/13/2016, 3:19 PM

## 2016-01-14 MED ORDER — BUPROPION HCL ER (XL) 150 MG PO TB24: 150.0000 mg | ORAL_TABLET | Freq: Every day | ORAL | 0 refills | Status: DC

## 2016-01-14 MED ORDER — TRAZODONE HCL 50 MG PO TABS: 50.0000 mg | ORAL_TABLET | Freq: Every day | ORAL | 1 refills | Status: DC

## 2016-01-14 MED ORDER — TRAZODONE HCL 50 MG PO TABS: 50.0000 mg | ORAL_TABLET | Freq: Every day | ORAL | Status: DC

## 2016-01-14 NOTE — Progress Notes (Signed)
Patient verbalizes readiness for discharge. Follow up plan explained, Rx's given. All belongings returned. Patient verbalizes understanding. Denies SI/HI and assures this Clinical research associatewriter he will seek assistance should that change. Patient discharged ambulatory and in stable condition to brother.

## 2016-01-14 NOTE — Tx Team (Signed)
Interdisciplinary Treatment and Diagnostic Plan Update  01/14/2016 Time of Session: 3:17 PM  Theodore Haymakerathaniel R Malicoat MRN: [id number]  Principal Diagnosis: MDD WITHOUT PSYCHOTIC FEATURES ADHD Secondary Diagnoses: Active Problems:   Major depressive disorder, single episode, severe without psychotic features (HCC)   Current Medications:  Current Facility-Administered Medications  Medication Dose Route Frequency Provider Last Rate Last Dose  . acetaminophen (TYLENOL) tablet 650 mg  650 mg Oral Q4H PRN Charm RingsJamison Y Lord, NP      . alum & mag hydroxide-simeth (MAALOX/MYLANTA) 200-200-20 MG/5ML suspension 30 mL  30 mL Oral PRN Charm RingsJamison Y Lord, NP      . buPROPion (WELLBUTRIN XL) 24 hr tablet 150 mg  150 mg Oral Daily Charm RingsJamison Y Lord, NP   150 mg at 01/14/16 [id number]  . hydrOXYzine (ATARAX/VISTARIL) tablet 25 mg  25 mg Oral TID PRN Charm RingsJamison Y Lord, NP      . ibuprofen (ADVIL,MOTRIN) tablet 600 mg  600 mg Oral Q8H PRN Charm RingsJamison Y Lord, NP      . magnesium hydroxide (MILK OF MAGNESIA) suspension 30 mL  30 mL Oral Daily PRN Charm RingsJamison Y Lord, NP      . ondansetron [facility](ZOFRAN) tablet 4 mg  4 mg Oral Q8H PRN Charm RingsJamison Y Lord, NP      . traZODone (DESYREL) tablet 50 mg  50 mg Oral QHS Charm RingsJamison Y Lord, NP   50 mg at 01/13/16 2251   PTA Medications: Prescriptions Prior to Admission  Medication Sig Dispense Refill Last Dose  . amphetamine-dextroamphetamine (ADDERALL XR) 20 MG 24 hr capsule Take 1-2 capsules (20-40 mg total) by mouth daily. (Patient not taking: Reported on 01/12/2016) 60 capsule 0 Not Taking at Unknown time  . amphetamine-dextroamphetamine (ADDERALL XR) 20 MG 24 hr capsule Take 1-2 capsules (20-40 mg total) by mouth daily. 60 capsule 0 01/11/2016 at 1800  . escitalopram (LEXAPRO) 10 MG tablet Take 1 tablet (10 mg total) by mouth daily. 30 tablet 6 ~2 months ago  . OVER THE COUNTER MEDICATION Take 1 scoop by mouth daily as needed (before workout). Pre-workout Powder   ~a month ago    Patient Stressors: Marital  or family conflict Occupational concerns  Patient Strengths: Capable of independent living Licensed conveyancerCommunication skills Financial means Physical Health Supportive family/friends  Treatment Modalities: Medication Management, Group therapy, Case management,  1 to 1 session with clinician, Psychoeducation, Recreational therapy.   Physician Treatment Plan for Primary Diagnosis: <principal problem not specified> Long Term Goal(s): Improvement in symptoms so as ready for discharge Improvement in symptoms so as ready for discharge   Short Term Goals: Ability to identify changes in lifestyle to reduce recurrence of condition will improve Ability to verbalize feelings will improve Ability to disclose and discuss suicidal ideas Ability to demonstrate self-control will improve Ability to identify and develop effective coping behaviors will improve Ability to maintain clinical measurements within normal limits will improve Compliance with prescribed medications will improve Ability to identify triggers associated with substance abuse/mental health issues will improve Ability to identify changes in lifestyle to reduce recurrence of condition will improve Ability to verbalize feelings will improve Ability to disclose and discuss suicidal ideas Ability to demonstrate self-control will improve Ability to identify and develop effective coping behaviors will improve Ability to maintain clinical measurements within normal limits will improve Compliance with prescribed medications will improve Ability to identify triggers associated with substance abuse/mental health issues will improve  Medication Management: Evaluate patient's response, side effects, and tolerance of medication regimen.  Therapeutic Interventions: 1  to 1 sessions, Unit Group sessions and Medication administration.  Evaluation of Outcomes: Adequate for Discharge  Physician Treatment Plan for Secondary Diagnosis: Active Problems:    Major depressive disorder, single episode, severe without psychotic features (HCC)  Long Term Goal(s): Improvement in symptoms so as ready for discharge Improvement in symptoms so as ready for discharge   Short Term Goals: Ability to identify changes in lifestyle to reduce recurrence of condition will improve Ability to verbalize feelings will improve Ability to disclose and discuss suicidal ideas Ability to demonstrate self-control will improve Ability to identify and develop effective coping behaviors will improve Ability to maintain clinical measurements within normal limits will improve Compliance with prescribed medications will improve Ability to identify triggers associated with substance abuse/mental health issues will improve Ability to identify changes in lifestyle to reduce recurrence of condition will improve Ability to verbalize feelings will improve Ability to disclose and discuss suicidal ideas Ability to demonstrate self-control will improve Ability to identify and develop effective coping behaviors will improve Ability to maintain clinical measurements within normal limits will improve Compliance with prescribed medications will improve Ability to identify triggers associated with substance abuse/mental health issues will improve     Medication Management: Evaluate patient's response, side effects, and tolerance of medication regimen.  Therapeutic Interventions: 1 to 1 sessions, Unit Group sessions and Medication administration.  Evaluation of Outcomes: Adequate for Discharge   RN Treatment Plan for Primary Diagnosis: MDD WITHOUT PSYCHOTIC FEATURES ADHD Long Term Goal(s): Knowledge of disease and therapeutic regimen to maintain health will improve  Short Term Goals: Ability to remain free from injury will improve and Ability to verbalize feelings will improve  Medication Management: RN will administer medications as ordered by provider, will assess and evaluate  patient's response and provide education to patient for prescribed medication. RN will report any adverse and/or side effects to prescribing provider.  Therapeutic Interventions: 1 on 1 counseling sessions, Psychoeducation, Medication administration, Evaluate responses to treatment, Monitor vital signs and CBGs as ordered, Perform/monitor CIWA, COWS, AIMS and Fall Risk screenings as ordered, Perform wound care treatments as ordered.  Evaluation of Outcomes: Adequate for Discharge   LCSW Treatment Plan for Primary Diagnosis: MDD WITHOUT PSYCHOTIC FEATURES ADHD Long Term Goal(s): Safe transition to appropriate next level of care at discharge, Engage patient in therapeutic group addressing interpersonal concerns.  Short Term Goals: Engage patient in aftercare planning with referrals and resources and Increase emotional regulation  Therapeutic Interventions: Assess for all discharge needs, 1 to 1 time with Social worker, Explore available resources and support systems, Assess for adequacy in community support network, Educate family and significant other(s) on suicide prevention, Complete Psychosocial Assessment, Interpersonal group therapy.  Evaluation of Outcomes: Progressing   Progress in Treatment: Attending groups: Yes. Participating in groups: Yes. Taking medication as prescribed: Yes. Toleration medication: Yes. Family/Significant other contact made: Yes, individual(s) contacted:  Brother, Fredderick Severance Patient understands diagnosis: Yes. Discussing patient identified problems/goals with staff: Yes. Medical problems stabilized or resolved: Yes. Denies suicidal/homicidal ideation: Yes. Issues/concerns per patient self-inventory: Yes. Other: NA  New problem(s) identified: No, Describe:  none  New Short Term/Long Term Goal(s):  Discharge Plan or Barriers: Pt will return home and follow-up with outpatient services.   Reason for Continuation of Hospitalization:  Anxiety Depression  Estimated Length of Stay: 3-5 days  Attendees: Patient: Theodore Williams, Theodore Williams 01/14/2016 3:17 PM  Physician: Seth Bake 01/14/2016 3:17 PM  Nursing: Merian Capron 01/14/2016 3:17 PM  RN Care Manager: Victorino Dike 01/14/2016 3:17 PM  Social  Worker: Chad Cordial 01/14/2016 3:17 PM  Recreational Therapist:  01/14/2016 3:17 PM  Other:  01/14/2016 3:17 PM  Other:  01/14/2016 3:17 PM  Other: 01/14/2016 3:17 PM    Scribe for Treatment Team: Elaina Hoops, LCSW 01/14/2016 3:17 PM

## 2016-01-14 NOTE — BHH Group Notes (Signed)
Pt attended group and the subject was on lifestyle changes. Writer ask what is one thing you would change about yourself or your life? Pt stated to surround himself with positive people to hang out with
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
## 2016-01-14 NOTE — Plan of Care (Signed)
Problem: Education: Goal: Verbalization of understanding the information provided will improve Outcome: Progressing Patient verbalizes understanding of staff teaching.   Problem: Safety: Goal: Ability to disclose and discuss suicidal ideas will improve Outcome: Progressing Patient denies SI, no self injury.

## 2016-01-14 NOTE — B[facility] Group Notes (Signed)
B[facility] Group Notes:  (Nursing/MHT/Case Management/Adjunct)  Date:  01/14/2016  Time:  0900  Type of Therapy:  Nurse Education  Participation Level:  Active  Participation Quality:  Attentive  Affect:  Anxious  Cognitive:  Oriented  Insight:  Improving  Engagement in Group:  Engaged  Modes of Intervention:  Education  Summary of Progress/Problems: Patient attended group and participated appropriately.  Merian CapronFriedman, Ezrael [facility] 01/14/2016, 0930

## 2016-01-14 NOTE — Progress Notes (Addendum)
  [facility] Discharge [facility] :  Will you be returning to the same living situation after discharge:  Yes,  Pt returning home At discharge, do you have transportation home?: Yes,  Pt brother to pick up Do you have the ability to pay for your medications: Yes,  Pt provided with prescriptions  Release of information consent forms completed and in the chart;  Patient's signature needed at discharge.  Patient to Follow up at: Follow-up Information    [facility] Follow up on 01/21/2016.   Why:  at 1:00pm for therapy with Raynelle FanningJulie. Contact information: [address] [place] [address] [phone]       Pt declines referral for phsyciatrist .           Next level of care provider [facility] Link:no  Safety Planning and Suicide Prevention discussed: Yes,  with brother; see SPE note  Have you used any form of tobacco in the last 30 days? (Cigarettes, Smokeless Tobacco, Cigars, and/or Pipes): No  Has patient been referred to the Quitline?: N/A patient is not a smoker  Patient has been referred for addiction treatment: Yes  Theodore Williams Lavonna RuaM Carter 01/14/2016, 3:25 PM

## 2016-01-14 NOTE — Progress Notes (Signed)
D: Patient up and visible in the milieu. Spoke with patient 1:1. Rates sleep good, appetite good, energy normal and concentration good. Patient's affect anxious with congruent mood. Rating his depression, hopelessness and anxiety at a 0/10. States goal for today is to "keep anxiety to none and think of things that won't bring on the anxiousness." Denies pain, physical problems.   A: Medicated per orders no prns needed or requested. Emotional support offered and self inventory reviewed.  R:  Patient denies SI/HI and remains safe on level III obs.

## 2016-01-14 NOTE — [facility] Suicide Risk Assessment (Signed)
[facility]   Principal Problem: <principal problem not specified> Discharge Diagnoses:  Patient Active Problem List   Diagnosis Date Noted  . Major depressive disorder, single episode, severe without psychosis (HCC) [F32.2] 01/12/2016  . Major depressive disorder, single episode, severe without psychotic features (HCC) [F32.2] 01/12/2016  . PCP NOTES >>>> [Z09] 02/18/2015  . Corns/callosities [L84] 05/05/2014  . Annual physical exam [Z00.00] 11/21/2011  . Heart murmur [R01.1] 11/21/2011  . ADHD (attention deficit hyperactivity disorder) [F90.9] 11/21/2011  . Acne [L70.9] 11/21/2011    Total Time spent with patient: 45 minutes  Musculoskeletal: Strength & Muscle Tone: within normal limits Gait & Station: normal Patient leans: NA  Psychiatric Specialty Exam: ROS  Blood pressure 109/79, pulse 87, temperature 97.5 F (36.4 C), temperature source Oral, resp. rate 18, height 5\' 8"  (1.727 m), weight 72.6 kg (160 lb).Body mass index is 24.33 kg/m.  General Appearance: Casual  Eye Contact::  Good  Speech:  Clear and Coherent and Normal Rate409  Volume:  Normal  Mood:  Euthymic  Affect:  Appropriate and Congruent  Thought Process:  Coherent and Goal Directed  Orientation:  Full (Time, Place, and Person)  Thought Content:  Logical  Suicidal Thoughts:  No  Homicidal Thoughts:  No  Memory:  Recent;   Fair Remote;   Fair  Judgement:  Fair  Insight:  Fair  Psychomotor Activity:  Normal  Concentration:  Fair  Recall:  FiservFair  Fund of Knowledge:Fair  Language: Fair  Akathisia:  No  Handed:  Right  AIMS (if indicated):     Assets:  Communication Skills Desire for Improvement Financial Resources/Insurance Housing Physical Health Social Support Transportation Vocational/Educational  Sleep:  Number of Hours: 6.5  Cognition: WNL  ADL's:  Intact   Mental Status Per Nursing Assessment::   On Admission:  NA  Demographic Factors:  Male, Caucasian and  Living alone  Loss Factors: NA  Historical Factors: Family history of mental illness or substance abuse and Impulsivity  Risk Reduction Factors:   Religious beliefs about death, Employed and Positive social support  Continued Clinical Symptoms:  Depression:   Impulsivity  Cognitive Features That Contribute To Risk:  Closed-mindedness    Suicide Risk:  Minimal: No identifiable suicidal ideation.  Patients presenting with no risk factors but with morbid ruminations; may be classified as minimal risk based on the severity of the depressive symptoms  Follow-up Information    Pt declines referrals for therapy and medication management .           Plan Of Care/Follow-up recommendations: Spoke with patient's brother Will who does not believe patient to be a danger to self but supports patient being in on going outpatient treatment. Brother also believe family counseling will be beneficial for patient and his parents. 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 Other:  Patient will follow up in therapy. Brother Will is very supportive and will assisst patient in this. Patient also says the family will have counselling through the family pastor  Wynelle BourgeoisUreh N Lekauwa, MD 01/14/2016, 3:09 PM

## 2016-01-18 ENCOUNTER — Telehealth: Payer: Self-pay | Admitting: *Deleted

## 2016-01-18 NOTE — Telephone Encounter (Signed)
Unable to reach patient at time of call.  Left message for patient to return call when available.   

## 2016-01-19 NOTE — Telephone Encounter (Signed)
Unable to reach patient at time of call.  Left message for patient to return call when available.   

## 2016-02-12 ENCOUNTER — Telehealth: Payer: Self-pay | Admitting: Internal Medicine

## 2016-02-12 NOTE — Telephone Encounter (Signed)
Last seen 10/14/15 and filled 12/02/15 #60 UDS 02/17/15 low risk   [facility] patient   Please advise    KP

## 2016-02-12 NOTE — Telephone Encounter (Signed)
Caller name: Relationship to patient: Self Can be reached: [phone]  Pharmacy:  Reason for call: Refill amphetamine-dextroamphetamine (ADDERALL XR) 20 MG 24 hr capsule [[id number]][[id number]] Theodore Williams([facility] Patient)

## 2016-02-13 NOTE — Telephone Encounter (Signed)
This was not sent to me until after I had left for the day. I will defer to PCP for refill.

## 2016-02-15 MED ORDER — AMPHETAMINE-DEXTROAMPHET ER 20 MG PO CP24: 20.0000 mg | ORAL_CAPSULE | Freq: Every day | ORAL | 0 refills | Status: DC

## 2016-02-15 NOTE — Telephone Encounter (Signed)
Please inform Pt that Rx's have been placed at front desk for pick up at his convenience. Thank you.  

## 2016-02-15 NOTE — Telephone Encounter (Signed)
Rx's printed for November and December 2017, awaiting MD signature.

## 2016-02-15 NOTE — Telephone Encounter (Signed)
Okay #60, 2 prescriptions 

## 2016-02-16 NOTE — Telephone Encounter (Signed)
Message left on VM for patient to pick up Rx

## 2016-02-17 ENCOUNTER — Encounter: Payer: Self-pay | Admitting: Internal Medicine

## 2016-03-01 ENCOUNTER — Telehealth: Payer: Self-pay | Admitting: Internal Medicine

## 2016-03-01 NOTE — Telephone Encounter (Signed)
UDS: + amphetamins, consistent with prescriptions. Low risk. Also was admitted to the hospital, please check how he is doing, didt he have an appropriate follow-up with psychiatrist?  He needs a routine office visit at his earliest convenience.

## 2016-03-02 NOTE — Telephone Encounter (Signed)
LMOM informing Pt to return call.  

## 2016-03-07 NOTE — Telephone Encounter (Signed)
Thank you, we'll see him in few weeks

## 2016-03-07 NOTE — Telephone Encounter (Signed)
Have been unable to contact Pt. Pt does have 6 month follow-up scheduled 04/14/2016.

## 2016-04-14 ENCOUNTER — Encounter: Payer: Self-pay | Admitting: Internal Medicine

## 2016-04-14 ENCOUNTER — Ambulatory Visit (INDEPENDENT_AMBULATORY_CARE_PROVIDER_SITE_OTHER): Payer: Managed Care, Other (non HMO) | Admitting: Internal Medicine

## 2016-04-14 VITALS — BP 118/76 | HR 81 | Temp 97.8°F | Resp 12 | Ht 68.0 in | Wt 164.1 lb

## 2016-04-14 DIAGNOSIS — F909 Attention-deficit hyperactivity disorder, unspecified type: Secondary | ICD-10-CM

## 2016-04-14 DIAGNOSIS — F322 Major depressive disorder, single episode, severe without psychotic features: Secondary | ICD-10-CM

## 2016-04-14 MED ORDER — AMPHETAMINE-DEXTROAMPHET ER 20 MG PO CP24: 20.0000 mg | ORAL_CAPSULE | Freq: Every day | ORAL | 0 refills | Status: DC

## 2016-04-14 NOTE — Progress Notes (Signed)
Subjective:    Patient ID: Theodore Williams, male    DOB: 19-Apr-1995, 20 y.o.   MRN: [id number]  DOS:  04/14/2016 Type of visit - description : Routine visit Interval history: ADHD: Due for a refill on Adderall, good compliance, no apparent side effects Was admitted to the hospital due to depression 12-2015, was prescribed medication. At this point, he is not taking any meds, few very well emotionally. States his stress was due to his job and now he is able to handle it better.  Review of Systems No insomnia, no suicidal ideas   Past Medical History:  Diagnosis Date  . Acne   . ADHD (attention deficit hyperactivity disorder)   . Heart murmur 11/21/2011   h/o    Past Surgical History:  Procedure Laterality Date  . SKIN GRAFT Left    ear    Social History   Social History  . Marital status: Single    Spouse name: n/a  . Number of children: 0  . Years of education: 12+   Occupational History  . Independent study for certification in networking    Social History Main Topics  . Smoking status: Former Games developermoker  . Smokeless tobacco: Never Used  . Alcohol use 0.0 oz/week     Comment: wine sometimes  . Drug use: No  . Sexual activity: Not on file   Other Topics Concern  . Not on file   Social History Narrative   Lives alone.   Family lives locally.      Allergies as of 04/14/2016      Reactions   Sulfa Drugs Cross Reactors Nausea And Vomiting      Medication List       Accurate as of 04/14/16 11:59 PM. Always use your most recent med list.          amphetamine-dextroamphetamine 20 MG 24 hr capsule Commonly known as:  ADDERALL XR Take 1-2 capsules (20-40 mg total) by mouth daily.   amphetamine-dextroamphetamine 20 MG 24 hr capsule Commonly known as:  ADDERALL XR Take 1-2 capsules (20-40 mg total) by mouth daily.          Objective:   Physical Exam BP 118/76 (BP Location: Left Arm, Patient Position: Sitting, Cuff Size: Normal)   Pulse 81    Temp 97.8 F (36.6 C) (Oral)   Resp 12   Ht 5\' 8"  (1.727 m)   Wt 164 lb 2 oz (74.4 kg)   SpO2 99%   BMI 24.96 kg/m  General:   Well developed, well nourished . NAD.  HEENT:  Normocephalic . Face symmetric, atraumatic Lungs:  CTA B Normal respiratory effort, no intercostal retractions, no accessory muscle use. Heart: RRR,  no murmur.  No pretibial edema bilaterally  Skin: Not pale. Not jaundice Neurologic:  alert & oriented X3.  Speech normal, gait appropriate for age and unassisted Psych--  Cognition and judgment appear intact.  Cooperative with normal attention span and concentration.  Behavior appropriate. No anxious or depressed appearing.      Assessment & Plan:  Assessment ADHD  Psych: Anxiety:  Rx Lexapro 08-2015 Major depression,s/p  admission 12-2015 VSD (small, membranous), last cards visit 2014, no SBE prophylaxis per cards note 2014  PLAN: ADHD: UDS low risk 11- 2017. RF meds Anxiety depression: Admitted to the hospital 12-2015, was rx medication, currently not taking it, feeling well, no suicidal ideas.  For now recommend observation. He does look well emotionally. Declined a flu shot RTC 6 months CPX

## 2016-04-14 NOTE — Patient Instructions (Signed)
GO TO THE FRONT DESK Schedule your next appointment for a  physical exam, 6 months from now. Fasting.   Call for refills as needed

## 2016-04-14 NOTE — Progress Notes (Signed)
Pre visit review using our clinic review tool, if applicable. No additional management support is needed unless otherwise documented below in the visit note. 

## 2016-04-15 NOTE — Assessment & Plan Note (Signed)
ADHD: UDS low risk 11- 2017. RF meds Anxiety depression: Admitted to the hospital 12-2015, was rx medication, currently not taking it, feeling well, no suicidal ideas.  For now recommend observation. He does look well emotionally. Declined a flu shot RTC 6 months CPX

## 2016-06-02 ENCOUNTER — Ambulatory Visit (INDEPENDENT_AMBULATORY_CARE_PROVIDER_SITE_OTHER): Payer: Managed Care, Other (non HMO) | Admitting: Medical

## 2016-06-02 ENCOUNTER — Encounter: Payer: Self-pay | Admitting: Medical

## 2016-06-02 VITALS — BP 124/66 | HR 88 | Temp 98.0°F | Ht 68.0 in | Wt 163.2 lb

## 2016-06-02 DIAGNOSIS — J029 Acute pharyngitis, unspecified: Secondary | ICD-10-CM | POA: Diagnosis not present

## 2016-06-02 LAB — POCT RAPID STREP A (OFFICE): Rapid Strep A Screen: POSITIVE — AB

## 2016-06-02 MED ORDER — AMOXICILLIN 875 MG PO TABS: 875.0000 mg | ORAL_TABLET | Freq: Two times a day (BID) | ORAL | 0 refills | Status: DC

## 2016-06-02 NOTE — Progress Notes (Signed)
Pre visit review using our clinic review tool, if applicable. No additional management support is needed unless otherwise documented below in the visit note. 

## 2016-06-02 NOTE — Patient Instructions (Addendum)
You have strep throat. Your strep test was positive. I am prescribing amoxicillin  antibiotic. Rest hydrate, tylenol for fever and warm salt water gargles.   Follow up in 7 days or as needed.

## 2016-06-02 NOTE — Progress Notes (Signed)
Subjective:    Patient ID: Theodore Williams, male    DOB: Mar 26, 1996, 21 y.o.   MRN: [id number]  HPI  St for 3-4 days. Mild at first put on the 4th day pain moderate to severe . Now Hurts to swollow.  No myalgias. No fever, no chills or sweats.   Review of Systems  Constitutional: Negative for chills, fatigue and fever.  HENT: Positive for sore throat. Negative for congestion, mouth sores, postnasal drip, sinus pressure and trouble swallowing.        Yesterday pain got worse with swollwing. Some dc.  Respiratory: Negative for cough.   Cardiovascular: Negative for chest pain.  Gastrointestinal: Negative for abdominal pain, nausea and vomiting.  Musculoskeletal: Negative for back pain, myalgias and neck stiffness.  Neurological: Negative for dizziness, weakness and headaches.  Hematological: Negative for adenopathy. Does not bruise/bleed easily.  Psychiatric/Behavioral: Negative for behavioral problems, confusion and hallucinations.    Past Medical History:  Diagnosis Date  . Acne   . ADHD (attention deficit hyperactivity disorder)   . Heart murmur 11/21/2011   h/o     Social History   Social History  . Marital status: Single    Spouse name: n/a  . Number of children: 0  . Years of education: 12+   Occupational History  . Independent study for certification in networking    Social History Main Topics  . Smoking status: Former Games developer  . Smokeless tobacco: Never Used  . Alcohol use 0.0 oz/week     Comment: wine sometimes  . Drug use: No  . Sexual activity: Not on file   Other Topics Concern  . Not on file   Social History Narrative   Lives alone.   Family lives locally.    Past Surgical History:  Procedure Laterality Date  . SKIN GRAFT Left    ear    Family History  Problem Relation Age of Onset  . Hyperlipidemia Father   . Hypertension Father   . Stroke Father   . Colon cancer Neg Hx   . Prostate cancer Neg Hx     Allergies  Allergen  Reactions  . Sulfa Drugs Cross Reactors Nausea And Vomiting    Current Outpatient Prescriptions on File Prior to Visit  Medication Sig Dispense Refill  . amphetamine-dextroamphetamine (ADDERALL XR) 20 MG 24 hr capsule Take 1-2 capsules (20-40 mg total) by mouth daily. 60 capsule 0  . amphetamine-dextroamphetamine (ADDERALL XR) 20 MG 24 hr capsule Take 1-2 capsules (20-40 mg total) by mouth daily. 60 capsule 0   No current facility-administered medications on file prior to visit.     BP 124/66   Pulse 88   Temp 98 F (36.7 C) (Oral)   Ht 5\' 8"  (1.727 m)   Wt 163 lb 3.2 oz (74 kg)   SpO2 95%   BMI 24.81 kg/m       Objective:   Physical Exam  General  Mental Status - Alert. General Appearance - Well groomed. Not in acute distress.  Skin Rashes- No Rashes.  HEENT Head- Normal. Ear Auditory Canal - Left- Normal. Right - Normal.Tympanic Membrane- Left- Normal. Right- Normal. Eye Sclera/Conjunctiva- Left- Normal. Right- Normal. Nose & Sinuses Nasal Mucosa- Left-  Boggy and Congested. Right-  Boggy and  Congested.Bilateral maxillary and frontal sinus pressure. Mouth & Throat Lips: Upper Lip- Normal: no dryness, cracking, pallor, cyanosis, or vesicular eruption. Lower Lip-Normal: no dryness, cracking, pallor, cyanosis or vesicular eruption. Buccal Mucosa- Bilateral- No Aphthous ulcers. Oropharynx- No  Discharge or Erythema. Tonsils: Characteristics- Bilateral- Erythema or Congestion. Size/Enlargement- Bilateral- 2+ enlargement. Discharge- bilateral-None.  Neck Neck- Supple. No Masses.   Chest and Lung Exam Auscultation: Breath Sounds:-Clear even and unlabored.  Cardiovascular Auscultation:Rythm- Regular, rate and rhythm. Murmurs & Other Heart Sounds:Ausculatation of the heart reveal- No Murmurs.  Lymphatic Head & Neck General Head & Neck Lymphatics: Bilateral: Description- No Localized lymphadenopathy.     Assessment & Plan:  You have strep throat. Your strep  test was positive. I am prescribing amoxicillin  antibiotic. Rest hydrate, tylenol for fever and warm salt water gargles.   Follow up in 7 days or as needed.

## 2016-08-18 ENCOUNTER — Telehealth: Payer: Self-pay | Admitting: Internal Medicine

## 2016-08-18 NOTE — Telephone Encounter (Signed)
Pt is requesting refill on Adderall.  Last OV: 04/14/2016 Last Fill: 04/14/2016 #60 and 0RF (For January and February 2018) UDS: 03/01/2016 Low risk  Please advise.

## 2016-08-18 NOTE — Telephone Encounter (Signed)
Caller name: Minerva Areolaric Relationship to patient: Father Can be reached: [phone]  Pharmacy:  Reason for call: Refill amphetamine-dextroamphetamine (ADDERALL XR) 20 MG 24 hr capsule [[id number]

## 2016-08-19 MED ORDER — AMPHETAMINE-DEXTROAMPHET ER 20 MG PO CP24: 20.0000 mg | ORAL_CAPSULE | Freq: Every day | ORAL | 0 refills | Status: DC

## 2016-08-19 NOTE — Telephone Encounter (Signed)
Informed father that Rx is ready for pick up. He will be by today for pick up.

## 2016-08-19 NOTE — Telephone Encounter (Signed)
Okay #60, 2 prescriptions 

## 2016-08-19 NOTE — Telephone Encounter (Signed)
Please inform Pt and/or his father, Theodore Williams that Rx's have been placed at front desk for pick up. Thank you.

## 2016-08-19 NOTE — Telephone Encounter (Signed)
Rx's for May and June 2018 printed, awaiting MD signature.  

## 2016-11-04 ENCOUNTER — Telehealth: Payer: Self-pay | Admitting: Internal Medicine

## 2016-11-04 MED ORDER — AMPHETAMINE-DEXTROAMPHET ER 20 MG PO CP24: 20.0000 mg | ORAL_CAPSULE | Freq: Every day | ORAL | 0 refills | Status: DC

## 2016-11-04 NOTE — Telephone Encounter (Signed)
LMOM informing Pt that Rx's have been placed at front desk at his convenience.

## 2016-11-04 NOTE — Telephone Encounter (Signed)
Patient calling to get refill for   amphetamine-dextroamphetamine (ADDERALL XR) 20 MG 24 hr capsule   [phone]

## 2016-11-04 NOTE — Telephone Encounter (Signed)
Okay 602

## 2016-11-04 NOTE — Telephone Encounter (Signed)
Pt is requesting refill on Adderall.   Last OV: 04/14/2016 w/ PCP, 06/02/2016 w/ Ramon DredgeEdward Last Fill: 08/19/2016 #60 and 0RF For May and June 2018 UDS: 02/17/2016 Low risk  [place] Controlled Substance Database printed; no issues noted.   Please advise.

## 2016-11-09 ENCOUNTER — Encounter: Payer: Self-pay | Admitting: Internal Medicine

## 2017-01-26 ENCOUNTER — Ambulatory Visit (INDEPENDENT_AMBULATORY_CARE_PROVIDER_SITE_OTHER): Payer: 59 | Admitting: Internal Medicine

## 2017-01-26 ENCOUNTER — Encounter: Payer: Self-pay | Admitting: Internal Medicine

## 2017-01-26 VITALS — BP 122/74 | HR 67 | Temp 98.1°F | Resp 14 | Ht 68.0 in | Wt 178.2 lb

## 2017-01-26 DIAGNOSIS — F909 Attention-deficit hyperactivity disorder, unspecified type: Secondary | ICD-10-CM | POA: Diagnosis not present

## 2017-01-26 DIAGNOSIS — M722 Plantar fascial fibromatosis: Secondary | ICD-10-CM

## 2017-01-26 MED ORDER — AMPHETAMINE-DEXTROAMPHET ER 20 MG PO CP24: 20.0000 mg | ORAL_CAPSULE | Freq: Every day | ORAL | 0 refills | Status: DC

## 2017-01-26 NOTE — Patient Instructions (Signed)
For plantar fasciitis:  Stretch twice a day ICE  the area at night Okay to take ibuprofen or Tylenol over-the-counter as needed Call if not improving  Get established with  a new primary doctor for future Adderall refills.   Heel Spur A heel spur is a bony growth that forms on the bottom of your heel bone (calcaneus). Heel spurs are common and do not always cause pain. However, heel spurs often cause inflammation in the strong band of tissue that runs underneath the bone of your foot (plantar fascia). When this happens, you may feel pain on the bottom of your foot, near your heel. What are the causes? The cause of heel spurs is not completely understood. They may be caused by pressure on the heel. Or, they may stem from the muscle attachments (tendons) near the spur pulling on the heel. What increases the risk? You may be at risk for a heel spur if you:  Are older than 40.  Are overweight.  Have wear and tear arthritis (osteoarthritis).  Have plantar fascia inflammation.  What are the signs or symptoms? Some people have heel spurs but no symptoms. If you do have symptoms, they may include:  Pain in the bottom of your heel.  Pain that is worse when you first get out of bed.  Pain that gets worse after walking or standing.  How is this diagnosed? Your health care provider may diagnose a heel spur based on your symptoms and a physical exam. You may also have an X-ray of your foot to check for a bony growth coming from the calcaneus. How is this treated? Treatment aims to relieve the pain from the heel spur. This may include:  Stretching exercises.  Losing weight.  Wearing specific shoes, inserts, or orthotics for comfort and support.  Wearing splints at night to properly position your feet.  Taking over-the-counter medicine to relieve pain.  Being treated with high-intensity sound waves to break up the heel spur (extracorporeal shock wave therapy).  Getting steroid  injections in your heel to reduce swelling and ease pain.  Having surgery if your heel spur causes long-term (chronic) pain.  Follow these instructions at home:  Take medicines only as directed by your health care provider.  Ask your health care provider if you should use ice or cold packs on the painful areas of your heel or foot.  Avoid activities that cause you pain until you recover or as directed by your health care provider.  Stretch before exercising or being physically active.  Wear supportive shoes that fit well as directed by your health care provider. You might need to buy new shoes. Wearing old shoes or shoes that do not fit correctly may not provide the support that you need.  Lose weight if your health care provider thinks you should. This can relieve pressure on your foot that may be causing pain and discomfort. Contact a health care provider if:  Your pain continues or gets worse. This information is not intended to replace advice given to you by your health care provider. Make sure you discuss any questions you have with your health care provider. Document Released: 05/11/2005 Document Revised: 09/10/2015 Document Reviewed: 06/05/2013 Elsevier Interactive Patient Education  [employer].

## 2017-01-26 NOTE — Progress Notes (Signed)
Pre visit review using our clinic review tool, if applicable. No additional management support is needed unless otherwise documented below in the visit note. 

## 2017-01-26 NOTE — Assessment & Plan Note (Signed)
Planta fasciitis: Educated patient about stretching. Also recommend ice, heel insert, OTCs for pain and call if not better ADHD: Patient now lives in [place], does  need a RF on adderrall, rx  issued. Future prescriptions will be by a new primary care where he lives at. If he desires to continue getting refills from here, will require a visit, UDS and contract. Anxiety depression: Not an issue at this point RTC when necessary

## 2017-01-26 NOTE — Progress Notes (Signed)
   Subjective:    Patient ID: Theodore Williams, male    DOB: 1995-07-11, 21 y.o.   MRN: [id number]  DOS:  01/26/2017 Type of visit - description : acute Interval history: 3 weeks history of heel pain, plantar aspect, right foot. No injury or swelling Pain increases when he takes steps. ADD: Needs refills   Review of Systems Denies anxiety, depression or insomnia  Past Medical History:  Diagnosis Date  . Acne   . ADHD (attention deficit hyperactivity disorder)   . Heart murmur 11/21/2011   h/o    Past Surgical History:  Procedure Laterality Date  . SKIN GRAFT Left    ear    Social History   Social History  . Marital status: Single    Spouse name: n/a  . Number of children: 0  . Years of education: 12+   Occupational History  . Independent study for certification in networking    Social History Main Topics  . Smoking status: Former Games developer  . Smokeless tobacco: Never Used  . Alcohol use 0.0 oz/week     Comment: wine sometimes  . Drug use: No  . Sexual activity: Not on file   Other Topics Concern  . Not on file   Social History Narrative   Lives alone.   Family lives locally.      Allergies as of 01/26/2017      Reactions   Sulfa Drugs Cross Reactors Nausea And Vomiting      Medication List       Accurate as of 01/26/17  2:47 PM. Always use your most recent med list.          amphetamine-dextroamphetamine 20 MG 24 hr capsule Commonly known as:  ADDERALL XR Take 1-2 capsules (20-40 mg total) by mouth daily.          Objective:   Physical Exam BP 122/74 (BP Location: Left Arm, Patient Position: Sitting, Cuff Size: Small)   Pulse 67   Temp 98.1 F (36.7 C) (Oral)   Resp 14   Ht  (1.727 m)   Wt 178 lb 4 oz (80.9 kg)   SpO2 97%   BMI 27.10 kg/m  General:   Well developed, well nourished . NAD.  HEENT:  Normocephalic . Face symmetric, atraumatic MSK: Feet: Normal to inspection and palpation except for some mild tenderness at  the plantar aspect of the right heel. Skin: Not pale. Not jaundice Neurologic:  alert & oriented X3.  Speech normal, gait appropriate for age and unassisted Psych--  Cognition and judgment appear intact.  Cooperative with normal attention span and concentration.  Behavior appropriate. No anxious or depressed appearing.      Assessment & Plan:  Assessment ADHD  Psych: Anxiety:  Rx Lexapro 08-2015 Major depression,s/p  admission 12-2015 VSD (small, membranous), last cards visit 2014, no SBE prophylaxis per cards note 2014  PLAN: Planta fasciitis: Educated patient about stretching. Also recommend ice, heel insert, OTCs for pain and call if not better ADHD: Patient now lives in [place], does  need a RF on adderrall, rx  issued. Future prescriptions will be by a new primary care where he lives at. If he desires to continue getting refills from here, will require a visit, UDS and contract. Anxiety depression: Not an issue at this point RTC when necessary

## 2017-02-03 ENCOUNTER — Telehealth: Payer: Self-pay

## 2017-02-03 NOTE — Telephone Encounter (Signed)
PA initiated via Covermymeds; KEY: [id number]. Awaiting determination.

## 2017-02-06 NOTE — Telephone Encounter (Signed)
Request Reference Number: [id number]. AMPHET/DEXTR CAP 20MG  ER is denied for not meeting the prior authorization requirement(s). For further questions, call [phone]  Per faxed PA notification. Medication is a plan exclusion and is not a covered benefit on this patient's plan.

## 2017-02-06 NOTE — Telephone Encounter (Signed)
MyChart message sent to Pt.

## 2017-02-17 NOTE — Telephone Encounter (Signed)
[facility], informed they received call from Pt regarding PA. Wanted to inform that if we ran PA for brand name Adderall XR that PA should be approved. Informed that PA was for brand name that was previously denied, however I would request another. KEY: [id number]. Awaiting determination.

## 2017-02-17 NOTE — Telephone Encounter (Signed)
Opened in error

## 2017-02-17 NOTE — Telephone Encounter (Signed)
Request Reference Number: [id number]. ADDERALL XR CAP 20MG  is approved through 02/17/2018. For further questions, call [phone].
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
## 2017-08-02 DIAGNOSIS — Z0000 Encounter for general adult medical examination without abnormal findings: Secondary | ICD-10-CM | POA: Diagnosis not present

## 2017-08-02 DIAGNOSIS — R011 Cardiac murmur, unspecified: Secondary | ICD-10-CM | POA: Diagnosis not present

## 2017-09-08 ENCOUNTER — Ambulatory Visit: Payer: 59 | Admitting: Internal Medicine

## 2017-09-08 ENCOUNTER — Encounter: Payer: Self-pay | Admitting: Internal Medicine

## 2017-09-08 VITALS — BP 112/74 | HR 80 | Temp 97.5°F | Resp 16 | Ht 68.0 in | Wt 183.4 lb

## 2017-09-08 DIAGNOSIS — Z79899 Other long term (current) drug therapy: Secondary | ICD-10-CM

## 2017-09-08 DIAGNOSIS — F902 Attention-deficit hyperactivity disorder, combined type: Secondary | ICD-10-CM

## 2017-09-08 NOTE — Progress Notes (Signed)
Subjective:    Patient ID: Theodore Williams, male    DOB: Feb 27, 1996, 22 y.o.   MRN: [id number]  DOS:  09/08/2017 Type of visit - description : rov Interval history: Feeling well, no major concerns, ADD well controlled.  Review of Systems Denies depression, anxiety. No insomnia.   Past Medical History:  Diagnosis Date  . Acne   . ADHD (attention deficit hyperactivity disorder)   . Heart murmur 11/21/2011   h/o    Past Surgical History:  Procedure Laterality Date  . SKIN GRAFT Left    ear    Social History   Socioeconomic History  . Marital status: Single    Spouse name: n/a  . Number of children: 0  . Years of education: 12+  . Highest education level: Not on file  Occupational History  . Occupation: Independent study for certification in networking  Social Needs  . Financial resource strain: Not on file  . Food insecurity:    Worry: Not on file    Inability: Not on file  . Transportation needs:    Medical: Not on file    Non-medical: Not on file  Tobacco Use  . Smoking status: Former Games developer  . Smokeless tobacco: Never Used  Substance and Sexual Activity  . Alcohol use: Yes    Alcohol/week: 0.0 oz    Comment: wine sometimes  . Drug use: No  . Sexual activity: Not on file  Lifestyle  . Physical activity:    Days per week: Not on file    Minutes per session: Not on file  . Stress: Not on file  Relationships  . Social connections:    Talks on phone: Not on file    Gets together: Not on file    Attends religious service: Not on file    Active member of club or organization: Not on file    Attends meetings of clubs or organizations: Not on file    Relationship status: Not on file  . Intimate partner violence:    Fear of current or ex partner: Not on file    Emotionally abused: Not on file    Physically abused: Not on file    Forced sexual activity: Not on file  Other Topics Concern  . Not on file  Social History Narrative   Lives alone.   Family lives locally.      Allergies as of 09/08/2017      Reactions   Sulfa Drugs Cross Reactors Nausea And Vomiting      Medication List        Accurate as of 09/08/17 11:59 PM. Always use your most recent med list.          amphetamine-dextroamphetamine 20 MG 24 hr capsule Commonly known as:  ADDERALL XR Take 1-2 capsules (20-40 mg total) by mouth daily.          Objective:   Physical Exam BP 112/74 (BP Location: Left Arm, Patient Position: Sitting, Cuff Size: Small)   Pulse 80   Temp (!) 97.5 F (36.4 C) (Oral)   Resp 16   Ht  (1.727 m)   Wt 183 lb 6 oz (83.2 kg)   SpO2 98%   BMI 27.88 kg/m   General:   Well developed, well nourished . NAD.  HEENT:  Normocephalic . Face symmetric, atraumatic Lungs:  CTA B Normal respiratory effort, no intercostal retractions, no accessory muscle use. Heart: RRR, + systolic murmur.  No pretibial edema bilaterally  Skin: Not  pale. Not jaundice Neurologic:  alert & oriented X3.  Speech normal, gait appropriate for age and unassisted Psych--  Cognition and judgment appear intact.  Cooperative with normal attention span and concentration.  Behavior appropriate. No anxious or depressed appearing.      Assessment & Plan:   Assessment Psych: ADHD Anxiety:  Rx Lexapro 08-2015 Major depression,s/p  admission 12-2015 VSD (small, membranous), last cards visit 2014, no SBE prophylaxis per cards note 2014  PLAN: ADD: Well-controlled, RF as needed, UDS and contract today.  Will need a new [facility] Anxiety, depression: Not an issue at this point Social: Lives in [place], plans to come back here for ADD management.  Has a girlfriend, will propose soon. RTC 6 months, CPX

## 2017-09-08 NOTE — Progress Notes (Signed)
Pre visit review using our clinic review tool, if applicable. No additional management support is needed unless otherwise documented below in the visit note. 

## 2017-09-08 NOTE — Patient Instructions (Signed)
GO TO THE LAB :  provide a urine sample  GO TO THE FRONT DESK Schedule your next appointment for a   sickle exam fasting in 6 months

## 2017-09-09 LAB — PAIN MGMT, PROFILE 8 W/CONF, U
6 Acetylmorphine: NEGATIVE ng/mL (ref <10)
AMPHETAMINES: NEGATIVE ng/mL (ref <500)
Alcohol Metabolites: NEGATIVE ng/mL (ref <500)
BUPRENORPHINE, URINE: NEGATIVE ng/mL (ref <5)
Benzodiazepines: NEGATIVE ng/mL (ref <100)
Cocaine Metabolite: NEGATIVE ng/mL (ref <150)
Creatinine: 36.3 mg/dL
MARIJUANA METABOLITE: NEGATIVE ng/mL (ref <20)
MDMA: NEGATIVE ng/mL (ref <500)
Opiates: NEGATIVE ng/mL (ref <100)
Oxidant: NEGATIVE ug/mL (ref <200)
Oxycodone: NEGATIVE ng/mL (ref <100)
pH: 5.88 (ref 4.5–9.0)

## 2017-09-10 NOTE — Assessment & Plan Note (Signed)
ADD: Well-controlled, RF as needed, UDS and contract today.  Will need a new [facility] Anxiety, depression: Not an issue at this point Social: Lives in [place], plans to come back here for ADD management.  Has a girlfriend, will propose soon. RTC 6 months, CPX

## 2017-10-12 ENCOUNTER — Encounter: Payer: Self-pay | Admitting: Internal Medicine

## 2017-10-24 ENCOUNTER — Encounter: Payer: Self-pay | Admitting: Internal Medicine

## 2017-10-24 MED ORDER — AMPHETAMINE-DEXTROAMPHET ER 20 MG PO CP24
20.0000 mg | ORAL_CAPSULE | Freq: Every day | ORAL | 0 refills | Status: DC
Start: 2017-10-24 — End: 2018-01-08

## 2017-10-24 NOTE — Telephone Encounter (Signed)
Prescription sent

## 2018-01-08 ENCOUNTER — Telehealth: Payer: Self-pay | Admitting: Internal Medicine

## 2018-01-08 MED ORDER — AMPHETAMINE-DEXTROAMPHET ER 20 MG PO CP24: 20.0000 mg | ORAL_CAPSULE | Freq: Every day | ORAL | 0 refills | Status: DC

## 2018-01-08 NOTE — Telephone Encounter (Signed)
Pt is requesting refill on Adderall XR 20mg .   Last OV: 09/08/2017 Last Fill: 10/24/2017 #60 and 0RF UDS: 09/08/2017 Low risk   NCCR printed- no discrepancies noted- sent for scanning

## 2018-01-08 NOTE — Telephone Encounter (Signed)
Sent!

## 2018-02-21 ENCOUNTER — Telehealth: Payer: Self-pay | Admitting: Internal Medicine

## 2018-02-22 MED ORDER — AMPHETAMINE-DEXTROAMPHET ER 20 MG PO CP24: 20.0000 mg | ORAL_CAPSULE | Freq: Every day | ORAL | 0 refills | Status: DC

## 2018-02-22 NOTE — Telephone Encounter (Signed)
Pt is requesting refill on Adderall XR 20mg .   Last OV: 09/08/2017, appt scheduled 03/09/2018 Last Fill: 01/08/2018 #60 and 0RF UDS: 09/08/2017 Low risk  NCCR in media from 01/08/2018

## 2018-02-22 NOTE — Telephone Encounter (Signed)
Sent!

## 2018-03-01 ENCOUNTER — Telehealth: Payer: Self-pay

## 2018-03-01 NOTE — Telephone Encounter (Signed)
PA approved.   Request Reference Number: [id number]. ADDERALL XR CAP 20MG  is approved through 03/02/2019. For further questions, call [phone]

## 2018-03-01 NOTE — Telephone Encounter (Signed)
PA initiated via Covermymeds; KEY: [id number]. Awaiting determination.

## 2018-03-09 ENCOUNTER — Encounter: Payer: 59 | Admitting: Internal Medicine

## 2018-05-04 ENCOUNTER — Other Ambulatory Visit: Payer: Self-pay

## 2018-05-04 ENCOUNTER — Ambulatory Visit (INDEPENDENT_AMBULATORY_CARE_PROVIDER_SITE_OTHER): Payer: 59 | Admitting: Internal Medicine

## 2018-05-04 ENCOUNTER — Encounter: Payer: Self-pay | Admitting: Internal Medicine

## 2018-05-04 VITALS — BP 112/80 | HR 76 | Temp 98.2°F | Resp 16 | Ht 68.0 in | Wt 186.5 lb

## 2018-05-04 DIAGNOSIS — Z0000 Encounter for general adult medical examination without abnormal findings: Secondary | ICD-10-CM

## 2018-05-04 DIAGNOSIS — Z79899 Other long term (current) drug therapy: Secondary | ICD-10-CM

## 2018-05-04 NOTE — Assessment & Plan Note (Addendum)
Td 2016; flu shot declined Outgrowth bexero HPV discussed, pt will consider it. Labs: CMP, FLP, A1c, TSH, UDS Diet and exercise: Discussed, room for improvement.

## 2018-05-04 NOTE — Progress Notes (Signed)
Subjective:    Patient ID: Theodore Williams, male    DOB: 09/24/1995, 23 y.o.   MRN: [id number]  DOS:  05/04/2018 Type of visit - description: CPX No major concerns  Review of Systems He specifically denies chest pain, palpitations, LOC  A 14 point review of systems is negative    Past Medical History:  Diagnosis Date  . Acne   . ADHD (attention deficit hyperactivity disorder)   . Heart murmur 11/21/2011   h/o    Past Surgical History:  Procedure Laterality Date  . SKIN GRAFT Left    ear    Social History   Socioeconomic History  . Marital status: Married    Spouse name: Herbert Seta  . Number of children: 0  . Years of education: 12+  . Highest education level: Not on file  Occupational History  . Occupation: networking  Social Needs  . Financial resource strain: Not on file  . Food insecurity:    Worry: Not on file    Inability: Not on file  . Transportation needs:    Medical: Not on file    Non-medical: Not on file  Tobacco Use  . Smoking status: Former Games developer  . Smokeless tobacco: Never Used  Substance and Sexual Activity  . Alcohol use: Yes    Alcohol/week: 0.0 standard drinks    Comment: wine sometimes  . Drug use: No  . Sexual activity: Not on file  Lifestyle  . Physical activity:    Days per week: Not on file    Minutes per session: Not on file  . Stress: Not on file  Relationships  . Social connections:    Talks on phone: Not on file    Gets together: Not on file    Attends religious service: Not on file    Active member of club or organization: Not on file    Attends meetings of clubs or organizations: Not on file    Relationship status: Not on file  . Intimate partner violence:    Fear of current or ex partner: Not on file    Emotionally abused: Not on file    Physically abused: Not on file    Forced sexual activity: Not on file  Other Topics Concern  . Not on file  Social History Narrative   Lives alone.   Family lives locally.      Family History  Problem Relation Age of Onset  . Hyperlipidemia Father   . Hypertension Father   . Stroke Father 56  . Colon cancer Neg Hx   . Prostate cancer Neg Hx      Allergies as of 05/04/2018      Reactions   Sulfa Drugs Cross Reactors Nausea And Vomiting      Medication List       Accurate as of May 04, 2018 11:59 PM. Always use your most recent med list.        amphetamine-dextroamphetamine 20 MG 24 hr capsule Commonly known as:  ADDERALL XR Take 1-2 capsules (20-40 mg total) by mouth daily.           Objective:   Physical Exam BP 112/80   Pulse 76   Temp 98.2 F (36.8 C) (Oral)   Resp 16   Ht 5\' 8"  (1.727 m)   Wt 186 lb 8 oz (84.6 kg)   SpO2 97%   BMI 28.36 kg/m  General: Well developed, NAD, BMI noted Neck: No  thyromegaly  HEENT:  Normocephalic .  Face symmetric, atraumatic Lungs:  CTA B Normal respiratory effort, no intercostal retractions, no accessory muscle use. Heart: RRR, + murmur.  No pretibial edema bilaterally  Abdomen:  Not distended, soft, non-tender. No rebound or rigidity.   Skin: Exposed areas without rash. Not pale. Not jaundice Neurologic:  alert & oriented X3.  Speech normal, gait appropriate for age and unassisted Strength symmetric and appropriate for age.  Psych: Cognition and judgment appear intact.  Cooperative with normal attention span and concentration.  Behavior appropriate. No anxious or depressed appearing.     Assessment     Assessment Psych: ADHD Anxiety:  Rx Lexapro 08-2015 Major depression,s/p  admission 12-2015 VSD (small, membranous), last cards visit 2014, no SBE prophylaxis per cards note 2014  PLAN: ADHD: Well-controlled, UDS today, RF as needed VSD: Offered a cardiology referral, last visit 2014, declined.  We agreed to discuss the issue next year RTC 6 months for routine checkup

## 2018-05-04 NOTE — Patient Instructions (Addendum)
GO TO THE LAB : Get the blood work     Consider getting a immunization called HPV

## 2018-05-05 LAB — CBC WITH DIFFERENTIAL/PLATELET
Absolute Monocytes: 694 cells/uL (ref 200–950)
BASOS PCT: 0.7 %
Basophils Absolute: 48 cells/uL (ref 0–200)
EOS PCT: 1.5 %
Eosinophils Absolute: 102 cells/uL (ref 15–500)
HCT: 50.3 % — ABNORMAL HIGH (ref 38.5–50.0)
HEMOGLOBIN: 17.6 g/dL — AB (ref 13.2–17.1)
Lymphs Abs: 3128 cells/uL (ref 850–3900)
MCH: 29.5 pg (ref 27.0–33.0)
MCHC: 35 g/dL (ref 32.0–36.0)
MCV: 84.4 fL (ref 80.0–100.0)
MONOS PCT: 10.2 %
MPV: 10.9 fL (ref 7.5–12.5)
NEUTROS ABS: 2829 {cells}/uL (ref 1500–7800)
Neutrophils Relative %: 41.6 %
PLATELETS: 212 10*3/uL (ref 140–400)
RBC: 5.96 10*6/uL — ABNORMAL HIGH (ref 4.20–5.80)
RDW: 13 % (ref 11.0–15.0)
TOTAL LYMPHOCYTE: 46 %
WBC: 6.8 10*3/uL (ref 3.8–10.8)

## 2018-05-05 LAB — COMPREHENSIVE METABOLIC PANEL
AG RATIO: 1.8 (calc) (ref 1.0–2.5)
ALKALINE PHOSPHATASE (APISO): 99 U/L (ref 40–115)
ALT: 13 U/L (ref 9–46)
AST: 17 U/L (ref 10–40)
Albumin: 4.5 g/dL (ref 3.6–5.1)
BILIRUBIN TOTAL: 0.7 mg/dL (ref 0.2–1.2)
BUN: 16 mg/dL (ref 7–25)
CALCIUM: 9.7 mg/dL (ref 8.6–10.3)
CHLORIDE: 99 mmol/L (ref 98–110)
CO2: 27 mmol/L (ref 20–32)
Creat: 1.01 mg/dL (ref 0.60–1.35)
GLOBULIN: 2.5 g/dL (ref 1.9–3.7)
GLUCOSE: 85 mg/dL (ref 65–99)
Potassium: 4.2 mmol/L (ref 3.5–5.3)
Sodium: 136 mmol/L (ref 135–146)
Total Protein: 7 g/dL (ref 6.1–8.1)

## 2018-05-05 LAB — HEMOGLOBIN A1C
HEMOGLOBIN A1C: 5.5 %{Hb} (ref <5.7)
MEAN PLASMA GLUCOSE: 111 (calc)
eAG (mmol/L): 6.2 (calc)

## 2018-05-05 LAB — TSH: TSH: 4.52 m[IU]/L — AB (ref 0.40–4.50)

## 2018-05-05 NOTE — Assessment & Plan Note (Signed)
ADHD: Well-controlled, UDS today, RF as needed VSD: Offered a cardiology referral, last visit 2014, declined.  We agreed to discuss the issue next year RTC 6 months for routine checkup

## 2018-05-07 LAB — PAIN MGMT, PROFILE 8 W/CONF, U
6 ACETYLMORPHINE: NEGATIVE ng/mL (ref <10)
AMPHETAMINE: 2765 ng/mL — AB (ref <250)
AMPHETAMINES: POSITIVE ng/mL — AB (ref <500)
Alcohol Metabolites: NEGATIVE ng/mL (ref <500)
BENZODIAZEPINES: NEGATIVE ng/mL (ref <100)
BUPRENORPHINE, URINE: NEGATIVE ng/mL (ref <5)
CREATININE: 169 mg/dL
Cocaine Metabolite: NEGATIVE ng/mL (ref <150)
MARIJUANA METABOLITE: NEGATIVE ng/mL (ref <20)
MDMA: NEGATIVE ng/mL (ref <500)
METHAMPHETAMINE: NEGATIVE ng/mL (ref <250)
OPIATES: NEGATIVE ng/mL (ref <100)
Oxidant: NEGATIVE ug/mL (ref <200)
Oxycodone: NEGATIVE ng/mL (ref <100)
PH: 6.26 (ref 4.5–9.0)

## 2018-05-25 ENCOUNTER — Other Ambulatory Visit: Payer: Self-pay | Admitting: Internal Medicine

## 2018-05-25 NOTE — Telephone Encounter (Signed)
Please advise refill in PCP's absence?  Last Adderall RX: 02/22/18, #60 Last OV: 05/04/18 Next OV: 7/17//20 UDS: 05/04/18 CSC: 09/08/17 CSR: No discrepancies identified

## 2018-05-27 MED ORDER — AMPHETAMINE-DEXTROAMPHET ER 20 MG PO CP24: 20.0000 mg | ORAL_CAPSULE | Freq: Every day | ORAL | 0 refills | Status: DC

## 2018-05-27 NOTE — Telephone Encounter (Signed)
Rx adderall sent to pt pharmacy. Refilled in absence of pcp.

## 2018-08-17 ENCOUNTER — Telehealth: Payer: Self-pay | Admitting: Medical

## 2018-08-20 MED ORDER — AMPHETAMINE-DEXTROAMPHET ER 20 MG PO CP24: 20.0000 mg | ORAL_CAPSULE | Freq: Every day | ORAL | 0 refills | Status: DC

## 2018-08-20 NOTE — Telephone Encounter (Signed)
Sent!

## 2018-08-20 NOTE — Telephone Encounter (Signed)
Adderall XR refill.   Last OV: 05/04/2018 Last Fill: 05/27/2018 #60 and 0RF UDS: 05/04/2018 Low risk

## 2018-11-02 ENCOUNTER — Ambulatory Visit: Payer: 59 | Admitting: Internal Medicine

## 2018-11-02 ENCOUNTER — Encounter: Payer: Self-pay | Admitting: Internal Medicine

## 2018-11-02 ENCOUNTER — Other Ambulatory Visit: Payer: Self-pay

## 2018-11-02 VITALS — BP 125/75 | HR 73 | Temp 98.0°F | Resp 16 | Ht 68.0 in | Wt 182.4 lb

## 2018-11-02 DIAGNOSIS — F909 Attention-deficit hyperactivity disorder, unspecified type: Secondary | ICD-10-CM

## 2018-11-02 DIAGNOSIS — R7989 Other specified abnormal findings of blood chemistry: Secondary | ICD-10-CM

## 2018-11-02 MED ORDER — AMPHETAMINE-DEXTROAMPHET ER 20 MG PO CP24: 20.0000 mg | ORAL_CAPSULE | Freq: Every day | ORAL | 0 refills | Status: DC

## 2018-11-02 NOTE — Progress Notes (Signed)
Subjective:    Patient ID: Theodore Williams, male    DOB: 04/11/1996, 23 y.o.   MRN: [id number]  DOS:  11/02/2018 Type of visit - description: f/u ADD follow-up, doing well, take medications correctly.  Some stress related to quarantine and COVID-19.  Recent TSH was slightly elevated    Review of Systems Denies fever chills No chest pain, lower extremity or edema. No cough No anxiety or depression per se  Past Medical History:  Diagnosis Date  . Acne   . ADHD (attention deficit hyperactivity disorder)   . Heart murmur 11/21/2011   h/o    Past Surgical History:  Procedure Laterality Date  . SKIN GRAFT Left    ear    Social History   Socioeconomic History  . Marital status: Married    Spouse name: Herbert SetaHeather  . Number of children: 0  . Years of education: 12+  . Highest education level: Not on file  Occupational History  . Occupation: networking  Social Needs  . Financial resource strain: Not on file  . Food insecurity    Worry: Not on file    Inability: Not on file  . Transportation needs    Medical: Not on file    Non-medical: Not on file  Tobacco Use  . Smoking status: Former Games developermoker  . Smokeless tobacco: Never Used  Substance and Sexual Activity  . Alcohol use: Yes    Alcohol/week: 0.0 standard drinks    Comment: wine sometimes  . Drug use: No  . Sexual activity: Not on file  Lifestyle  . Physical activity    Days per week: Not on file    Minutes per session: Not on file  . Stress: Not on file  Relationships  . Social Musicianconnections    Talks on phone: Not on file    Gets together: Not on file    Attends religious service: Not on file    Active member of club or organization: Not on file    Attends meetings of clubs or organizations: Not on file    Relationship status: Not on file  . Intimate partner violence    Fear of current or ex partner: Not on file    Emotionally abused: Not on file    Physically abused: Not on file    Forced sexual  activity: Not on file  Other Topics Concern  . Not on file  Social History Narrative   Lives alone.   Family lives locally.      Allergies as of 11/02/2018      Reactions   Sulfa Drugs Cross Reactors Nausea And Vomiting      Medication List       Accurate as of November 02, 2018  1:55 PM. If you have any questions, ask your nurse or doctor.        amphetamine-dextroamphetamine 20 MG 24 hr capsule Commonly known as: Adderall XR Take 1-2 capsules (20-40 mg total) by mouth daily.           Objective:   Physical Exam BP 125/75 (BP Location: Left Arm, Patient Position: Sitting, Cuff Size: Small)   Pulse 73   Temp 98 F (36.7 C) (Oral)   Resp 16   Ht 5\' 8"  (1.727 m)   Wt 182 lb 6 oz (82.7 kg)   SpO2 99%   BMI 27.73 kg/m  General:   Well developed, NAD, BMI noted. HEENT:  Normocephalic . Face symmetric, atraumatic Lungs:  CTA B Normal respiratory effort,  no intercostal retractions, no accessory muscle use. Heart: RRR, mild systolic murmur.  No pretibial edema bilaterally  Skin: Not pale. Not jaundice Neurologic:  alert & oriented X3.  Speech normal, gait appropriate for age and unassisted Psych--  Cognition and judgment appear intact.  Cooperative with normal attention span and concentration.  Behavior appropriate. No anxious or depressed appearing.      Assessment    Assessment Psych: ADHD Anxiety:  Rx Lexapro 08-2015 Major depression,s/p  admission 12-2015 VSD (small, membranous), last cards visit 2014, no SBE prophylaxis per cards note 2014  PLAN: ADHD: Well-controlled with Adderall, RF sent, last UDS satisfactory Slight increased TSH: Recheck labs VSD: Asymptomatic. Social: Since the last visit he was married, lives and works in [place]  RTC 6 months CPX

## 2018-11-02 NOTE — Progress Notes (Signed)
Pre visit review using our clinic review tool, if applicable. No additional management support is needed unless otherwise documented below in the visit note. 

## 2018-11-03 LAB — T4, FREE: Free T4: 1.4 ng/dL (ref 0.8–1.8)

## 2018-11-03 LAB — TSH: TSH: 4.34 mIU/L (ref 0.40–4.50)

## 2018-11-03 LAB — T3, FREE: T3, Free: 3.9 pg/mL (ref 2.3–4.2)

## 2018-11-03 NOTE — Assessment & Plan Note (Signed)
ADHD: Well-controlled with Adderall, RF sent, last UDS satisfactory Slight increased TSH: Recheck labs VSD: Asymptomatic. Social: Since the last visit he was married, lives and works in [place]  RTC 6 months CPX

## 2019-01-09 ENCOUNTER — Telehealth: Payer: Self-pay | Admitting: Internal Medicine

## 2019-01-10 MED ORDER — AMPHETAMINE-DEXTROAMPHET ER 20 MG PO CP24: 20.0000 mg | ORAL_CAPSULE | Freq: Every day | ORAL | 0 refills | Status: DC

## 2019-01-10 NOTE — Telephone Encounter (Signed)
done

## 2019-01-10 NOTE — Telephone Encounter (Signed)
Adderall refill.   Last OV: 11/02/2018 Last Fill: 11/02/2018 #60 and 0RF UDS: 05/04/2018 Low risk

## 2019-03-01 ENCOUNTER — Other Ambulatory Visit: Payer: Self-pay

## 2019-03-04 ENCOUNTER — Ambulatory Visit (HOSPITAL_BASED_OUTPATIENT_CLINIC_OR_DEPARTMENT_OTHER)
Admission: RE | Admit: 2019-03-04 | Discharge: 2019-03-04 | Disposition: A | Discharge status: Home / Self Care | Payer: 59 | Source: Ambulatory Visit | Attending: Internal Medicine | Admitting: Internal Medicine

## 2019-03-04 ENCOUNTER — Other Ambulatory Visit: Payer: Self-pay

## 2019-03-04 ENCOUNTER — Telehealth: Payer: Self-pay

## 2019-03-04 ENCOUNTER — Ambulatory Visit: Payer: 59 | Admitting: Internal Medicine

## 2019-03-04 ENCOUNTER — Encounter: Payer: Self-pay | Admitting: Internal Medicine

## 2019-03-04 VITALS — BP 128/71 | HR 91 | Temp 97.1°F | Resp 16 | Ht 68.0 in | Wt 177.0 lb

## 2019-03-04 DIAGNOSIS — M79641 Pain in right hand: Secondary | ICD-10-CM | POA: Diagnosis not present

## 2019-03-04 MED ORDER — AMPHETAMINE-DEXTROAMPHET ER 20 MG PO CP24: 20.0000 mg | ORAL_CAPSULE | Freq: Every day | ORAL | 0 refills | Status: DC

## 2019-03-04 NOTE — Telephone Encounter (Signed)
PA denied. Requested medication and/or diagnosis is not a covered benefit and excluded from coverage in plan.

## 2019-03-04 NOTE — Progress Notes (Signed)
Pre visit review using our clinic review tool, if applicable. No additional management support is needed unless otherwise documented below in the visit note. 

## 2019-03-04 NOTE — Patient Instructions (Signed)
   Get a XR    Use Voltaren Gel OTC 3- 4 times a day as needed  IBUPROFEN (Advil or Motrin) 200 mg 2 tablets every 8  hours as needed for pain.  Always take it with food because may cause gastritis and ulcers.  If you notice nausea, stomach pain, change in the color of stools --->  Stop the medicine and let us know

## 2019-03-04 NOTE — Progress Notes (Signed)
Subjective:    Patient ID: Theodore Williams, male    DOB: [birth year], 23 y.o.   MRN: [id number]  DOS:  03/04/2019 Type of visit - description: Acute The patient has being very busy, handwriting several  hours every day for the last 6 weeks. Today reports pain around the right fifth knuckle. Pain is worse when he writes and decreases with rest. Has not taken any medications for it  Review of Systems  Denies other joint problems No numbness anywhere No redness or swelling No trauma per se Has a family history of DJD (mother) Past Medical History:  Diagnosis Date  . Acne   . ADHD (attention deficit hyperactivity disorder)   . Heart murmur 11/21/2011   h/o    Past Surgical History:  Procedure Laterality Date  . SKIN GRAFT Left    ear    Social History   Socioeconomic History  . Marital status: Married    Spouse name: Theodore Williams  . Number of children: 0  . Years of education: 12+  . Highest education level: Not on file  Occupational History  . 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 Financial resource strain: Not on file  . Food insecurity    Worry: Not on file    Inability: Not on file  . Transportation needs    Medical: Not on file    Non-medical: Not on file  Tobacco Use  . Smoking status: Former Games developer  . Smokeless tobacco: Never Used  Substance and Sexual Activity  . Alcohol use: Yes    Alcohol/week: 0.0 standard drinks    Comment: wine sometimes  . Drug use: No  . Sexual activity: Not on file  Lifestyle  . Physical activity    Days per week: Not on file    Minutes per session: Not on file  . Stress: Not on file  Relationships  . Social Musician on phone: Not on file    Gets together: Not on file    Attends religious service: Not on file    Active member of club or organization: Not on file    Attends meetings of clubs or organizations: Not on file    Relationship status: Not on file  . Intimate partner violence   Fear of current or ex partner: Not on file    Emotionally abused: Not on file    Physically abused: Not on file    Forced sexual activity: Not on file  Other Topics Concern  . Not on file  Social History Narrative   Lives w/ wife    Family lives locally.      Allergies as of 03/04/2019      Reactions   Sulfa Drugs Cross Reactors Nausea And Vomiting      Medication List       Accurate as of March 04, 2019 10:48 AM. If you have any questions, ask your nurse or doctor.        amphetamine-dextroamphetamine 20 MG 24 hr capsule Commonly known as: Adderall XR Take 1-2 capsules (20-40 mg total) by mouth daily.           Objective:   Physical Exam BP 128/71 (BP Location: Left Arm, Patient Position: Sitting, Cuff Size: Small)   Pulse 91   Temp (!) 97.1 F (36.2 C) (Temporal)   Resp 16   Ht 5\' 8"  (1.727 m)   Wt 177 lb (80.3 kg)   SpO2 99%   BMI 26.91 kg/m  General:   Well developed, NAD, BMI noted. HEENT:  Normocephalic . Face symmetric, atraumatic MSK: Hands and wrists: Normal to inspection, palpation with no synovitis, redness, swelling, injury.  Good capillary refill throughout. Palpation of the fifth right knuckle is actually normal with minimal TTP. Flexion extension of fingers increase the pain to some extent Neurologic:  alert & oriented X3.  Speech normal, gait appropriate for age and unassisted Psych--  Cognition and judgment appear intact.  Cooperative with normal attention span and concentration.  Behavior appropriate. No anxious or depressed appearing.      Assessment     Assessment Psych: ADHD Anxiety:  Rx Lexapro 08-2015 Major depression,s/p  admission 12-2015 VSD (small, membranous), last cards visit 2014, no SBE prophylaxis per cards note 2014  PLAN: Pain, right hand: Pain located around the fifth knuckle, in the context of overuse, related to overuse? To be sure we will get a x-ray otherwise recommend rest, Voltaren gel OTC, ibuprofen  orally, GI precautions discussed, call if not improving

## 2019-03-04 NOTE — Telephone Encounter (Signed)
PA initiated via Covermymeds; KEY: [id number]. Awaiting determination.

## 2019-03-04 NOTE — Progress Notes (Signed)
6 weeks ago Right hand, on pinky finger knuckles Writing for 5 hours a day, for 6 weeks Would start while writing, only occurred while writing Now it hurts when he uses that part of his hand Has not taken anything over the counter for it No trauma to the hand No numbness or tingling in hand or finger No redness swelling warmth in area No other joints hurting No recent fevers or chills   Mother has arthritis, patient is worried about arthritis

## 2019-03-05 NOTE — Assessment & Plan Note (Signed)
Pain, right hand: Pain located around the fifth knuckle, in the context of overuse, related to overuse? To be sure we will get a x-ray otherwise recommend rest, Voltaren gel OTC, ibuprofen orally, GI precautions discussed, call if not improving

## 2019-05-09 ENCOUNTER — Encounter: Payer: 59 | Admitting: Internal Medicine

## 2019-05-29 ENCOUNTER — Encounter: Payer: Self-pay | Admitting: Internal Medicine

## 2019-05-29 ENCOUNTER — Other Ambulatory Visit: Payer: Self-pay

## 2019-05-29 ENCOUNTER — Telehealth: Payer: Self-pay

## 2019-05-29 ENCOUNTER — Ambulatory Visit (INDEPENDENT_AMBULATORY_CARE_PROVIDER_SITE_OTHER): Payer: 59 | Admitting: Internal Medicine

## 2019-05-29 VITALS — BP 131/82 | HR 75 | Temp 96.6°F | Resp 16 | Ht 68.0 in | Wt 168.2 lb

## 2019-05-29 DIAGNOSIS — Q210 Ventricular septal defect: Secondary | ICD-10-CM

## 2019-05-29 DIAGNOSIS — F909 Attention-deficit hyperactivity disorder, unspecified type: Secondary | ICD-10-CM | POA: Diagnosis not present

## 2019-05-29 DIAGNOSIS — Z0000 Encounter for general adult medical examination without abnormal findings: Secondary | ICD-10-CM

## 2019-05-29 LAB — LIPID PANEL
Cholesterol: 223 mg/dL — ABNORMAL HIGH (ref 0–200)
HDL: 42 mg/dL (ref >39.00)
LDL Cholesterol: 164 mg/dL — ABNORMAL HIGH (ref 0–99)
NonHDL: 180.61
Total CHOL/HDL Ratio: 5
Triglycerides: 83 mg/dL (ref 0.0–149.0)
VLDL: 16.6 mg/dL (ref 0.0–40.0)

## 2019-05-29 LAB — CBC WITH DIFFERENTIAL/PLATELET
Basophils Absolute: 0 10*3/uL (ref 0.0–0.1)
Basophils Relative: 0.5 % (ref 0.0–3.0)
Eosinophils Absolute: 0.1 10*3/uL (ref 0.0–0.7)
Eosinophils Relative: 0.8 % (ref 0.0–5.0)
HCT: 48 % (ref 39.0–52.0)
Hemoglobin: 16.2 g/dL (ref 13.0–17.0)
Lymphocytes Relative: 25.7 % (ref 12.0–46.0)
Lymphs Abs: 2 10*3/uL (ref 0.7–4.0)
MCHC: 33.8 g/dL (ref 30.0–36.0)
MCV: 88 fl (ref 78.0–100.0)
Monocytes Absolute: 0.6 10*3/uL (ref 0.1–1.0)
Monocytes Relative: 8.2 % (ref 3.0–12.0)
Neutro Abs: 5 10*3/uL (ref 1.4–7.7)
Neutrophils Relative %: 64.8 % (ref 43.0–77.0)
Platelets: 189 10*3/uL (ref 150.0–400.0)
RBC: 5.45 Mil/uL (ref 4.22–5.81)
RDW: 13.5 % (ref 11.5–15.5)
WBC: 7.8 10*3/uL (ref 4.0–10.5)

## 2019-05-29 LAB — TSH: TSH: 3.03 u[IU]/mL (ref 0.35–4.50)

## 2019-05-29 NOTE — Telephone Encounter (Signed)
PA approved.   Request Reference Number: [id number]. ADDERALL XR CAP 20MG  is approved through 05/28/2020. Your patient may now fill this prescription and it will be covered.

## 2019-05-29 NOTE — Progress Notes (Signed)
Pre visit review using our clinic review tool, if applicable. No additional management support is needed unless otherwise documented below in the visit note. 

## 2019-05-29 NOTE — Progress Notes (Signed)
   Subjective:    Patient ID: Theodore Williams, male    DOB: 06/10/1995, 24 y.o.   MRN: [id number]  DOS:  05/29/2019 Type of visit - description: CPX No concerns, doing great with lifestyle, losing weight.  Wt Readings from Last 3 Encounters:  05/29/19 168 lb 4 oz (76.3 kg)  03/04/19 177 lb (80.3 kg)  11/02/18 182 lb 6 oz (82.7 kg)     Review of Systems   A 14 point review of systems is negative    Past Medical History:  Diagnosis Date  . Acne   . ADHD (attention deficit hyperactivity disorder)   . Heart murmur 11/21/2011   h/o    Past Surgical History:  Procedure Laterality Date  . SKIN GRAFT Left    ear    Allergies as of 05/29/2019      Reactions   Sulfa Drugs Cross Reactors Nausea And Vomiting      Medication List       Accurate as of May 29, 2019 11:59 PM. If you have any questions, ask your nurse or doctor.        amphetamine-dextroamphetamine 20 MG 24 hr capsule Commonly known as: Adderall XR Take 1-2 capsules (20-40 mg total) by mouth daily.      Family History  Problem Relation Age of Onset  . Hyperlipidemia Father   . Hypertension Father   . Stroke Father 61  . Colon cancer Neg Hx   . Prostate cancer Neg Hx           Objective:   Physical Exam BP 131/82 (BP Location: Left Arm, Patient Position: Sitting, Cuff Size: Small)   Pulse 75   Temp (!) 96.6 F (35.9 C) (Temporal)   Resp 16   Ht 5\' 8"  (1.727 m)   Wt 168 lb 4 oz (76.3 kg)   SpO2 99%   BMI 25.58 kg/m  General: Well developed, NAD, BMI noted Neck: No  thyromegaly  HEENT:  Normocephalic . Face symmetric, atraumatic Lungs:  CTA B Normal respiratory effort, no intercostal retractions, no accessory muscle use. Heart: RRR, + systolic murmur.  No pretibial edema bilaterally  Abdomen:  Not distended, soft, non-tender. No rebound or rigidity.   Skin: Exposed areas without rash. Not pale. Not jaundice Neurologic:  alert & oriented X3.  Speech normal, gait appropriate  for age and unassisted Strength symmetric and appropriate for age.  Psych: Cognition and judgment appear intact.  Cooperative with normal attention span and concentration.  Behavior appropriate. No anxious or depressed appearing.     Assessment     Assessment Psych: ADHD Anxiety:  Rx Lexapro 08-2015 Major depression,s/p  admission 12-2015 VSD (small, membranous), last cards visit 2014, no SBE prophylaxis per cards note 2014  PLAN: Here for CPX ADHD: On Adderall.  UDS on RTC VSD: Not seen in cardiology for a while, asymptomatic, refer to cardiology for routine checkup. RTC 6 months    This visit occurred during the SARS-CoV-2 public health emergency.  Safety protocols were in place, including screening questions prior to the visit, additional usage of staff PPE, and extensive cleaning of exam room while observing appropriate contact time as indicated for disinfecting solutions.

## 2019-05-29 NOTE — Telephone Encounter (Signed)
PA initiated via Covermymeds; KEY: [id number]. Awaiting determination.

## 2019-05-29 NOTE — Patient Instructions (Signed)
GO TO THE LAB : Get the blood work     GO TO THE FRONT DESK Come back for a a checkup in 6 months, please make an appointment    Your Adderall was approved.  We  notified  W M

## 2019-05-30 NOTE — Assessment & Plan Note (Signed)
Td 2016 covid vaccine d/w pt, pro-cons  HPV discussed, declined. Labs: FLP, CBC, TSH Diet and exercise: Doing great, has increase his cardio and lifting weight, eating healthy, + weight loss.

## 2019-05-30 NOTE — Assessment & Plan Note (Signed)
Here for CPX ADHD: On Adderall.  UDS on RTC VSD: Not seen in cardiology for a while, asymptomatic, refer to cardiology for routine checkup. RTC 6 months

## 2019-06-10 ENCOUNTER — Ambulatory Visit (INDEPENDENT_AMBULATORY_CARE_PROVIDER_SITE_OTHER): Payer: 59 | Admitting: Cardiology

## 2019-06-10 ENCOUNTER — Other Ambulatory Visit: Payer: Self-pay

## 2019-06-10 ENCOUNTER — Encounter: Payer: Self-pay | Admitting: Cardiology

## 2019-06-10 VITALS — BP 112/84 | HR 89 | Ht 68.0 in | Wt 165.0 lb

## 2019-06-10 DIAGNOSIS — F909 Attention-deficit hyperactivity disorder, unspecified type: Secondary | ICD-10-CM | POA: Diagnosis not present

## 2019-06-10 DIAGNOSIS — Q210 Ventricular septal defect: Secondary | ICD-10-CM

## 2019-06-10 DIAGNOSIS — R011 Cardiac murmur, unspecified: Secondary | ICD-10-CM | POA: Diagnosis not present

## 2019-06-10 NOTE — Addendum Note (Signed)
Addended by: Lita Mains on: 06/10/2019 09:57 AM   Modules accepted: Orders

## 2019-06-10 NOTE — Patient Instructions (Signed)
Medication Instructions:  Your physician recommends that you continue on your current medications as directed. Please refer to the Current Medication list given to you today.  *If you need a refill on your cardiac medications before your next appointment, please call your pharmacy*  Lab Work: None.  If you have labs (blood work) drawn today and your tests are completely normal, you will receive your results only by: Marland Kitchen MyChart Message (if you have MyChart) OR . A paper copy in the mail If you have any lab test that is abnormal or we need to change your treatment, we will call you to review the results.  Testing/Procedures: Your physician has requested that you have an echocardiogram. Echocardiography is a painless test that uses sound waves to create images of your heart. It provides your doctor with information about the size and shape of your heart and how well your heart's chambers and valves are working. This procedure takes approximately one hour. There are no restrictions for this procedure.    Follow-Up: At [facility], you and your health needs are our priority.  As part of our continuing mission to provide you with exceptional heart care, we have created designated Provider Care Teams.  These Care Teams include your primary Cardiologist (physician) and Advanced Practice Providers (APPs -  Physician Assistants and Nurse Practitioners) who all work together to provide you with the care you need, when you need it.  Your next appointment:   6 week(s)  The format for your next appointment:   In Person  Provider:   You may see Dr. Agustin Cree or the following Advanced Practice Provider on your designated Care Team:    Laurann Montana, FNP   Other Instructions   Echocardiogram An echocardiogram is a procedure that uses painless sound waves (ultrasound) to produce an image of the heart. Images from an echocardiogram can provide important information about:  Signs of coronary  artery disease (CAD).  Aneurysm detection. An aneurysm is a weak or damaged part of an artery wall that bulges out from the normal force of blood pumping through the body.  Heart size and shape. Changes in the size or shape of the heart can be associated with certain conditions, including heart failure, aneurysm, and CAD.  Heart muscle function.  Heart valve function.  Signs of a past heart attack.  Fluid buildup around the heart.  Thickening of the heart muscle.  A tumor or infectious growth around the heart valves. Tell a health care provider about:  Any allergies you have.  All medicines you are taking, including vitamins, herbs, eye drops, creams, and over-the-counter medicines.  Any blood disorders you have.  Any surgeries you have had.  Any medical conditions you have.  Whether you are pregnant or may be pregnant. What are the risks? Generally, this is a safe procedure. However, problems may occur, including:  Allergic reaction to dye (contrast) that may be used during the procedure. What happens before the procedure? No specific preparation is needed. You may eat and drink normally. What happens during the procedure?   An IV tube may be inserted into one of your veins.  You may receive contrast through this tube. A contrast is an injection that improves the quality of the pictures from your heart.  A gel will be applied to your chest.  A wand-like tool (transducer) will be moved over your chest. The gel will help to transmit the sound waves from the transducer.  The sound waves will harmlessly bounce  off of your heart to allow the heart images to be captured in real-time motion. The images will be recorded on a computer. The procedure may vary among health care providers and hospitals. What happens after the procedure?  You may return to your normal, everyday life, including diet, activities, and medicines, unless your health care provider tells you not to do  that. Summary  An echocardiogram is a procedure that uses painless sound waves (ultrasound) to produce an image of the heart.  Images from an echocardiogram can provide important information about the size and shape of your heart, heart muscle function, heart valve function, and fluid buildup around your heart.  You do not need to do anything to prepare before this procedure. You may eat and drink normally.  After the echocardiogram is completed, you may return to your normal, everyday life, unless your health care provider tells you not to do that. This information is not intended to replace advice given to you by your health care provider. Make sure you discuss any questions you have with your health care provider. Document Revised: 07/26/2018 Document Reviewed: 05/07/2016 Elsevier Patient Education  2020 [employer].

## 2019-06-10 NOTE — Progress Notes (Signed)
Cardiology Consultation:    Date:  06/10/2019   ID:  Theodore Williams, DOB 1996-01-11, MRN [id number]  PCP:  Theodore Plump, MD  Cardiologist:  Theodore Balsam, MD   Referring MD: Theodore Plump, MD   No chief complaint on file. I need to see cardiologist  History of Present Illness:    Theodore Williams is a 24 y.o. male who is being seen today for the evaluation of VSD at the request of Theodore Plump, MD.  Apparently when he was 24 years old he was told to have some congenital heart problem.  There is some information about VSD in the chart but he does not know exactly what was the problem.  He used to see cardiologist on the regular basis however last time he seen somebody was more than 7 years ago.  Overall he is doing well.  Denies have any palpitations, no chest pain, no tightness, no pressure, no burning in the chest.  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  He goes to gym 3 times a week and exercise quite aggressively with no apparent problems with it.  Again he would like to be establish as a patient because of heart condition.  Past Medical History:  Diagnosis Date  . Acne   . ADHD (attention deficit hyperactivity disorder)   . Heart murmur 11/21/2011   h/o    Past Surgical History:  Procedure Laterality Date  . SKIN GRAFT Left    ear    Current Medications: Current Meds  Medication Sig  . amphetamine-dextroamphetamine (ADDERALL XR) 20 MG 24 hr capsule Take 1-2 capsules (20-40 mg total) by mouth daily.     Allergies:   Sulfa drugs cross reactors   Social History   Socioeconomic History  . Marital status: Married    Spouse name: Herbert Seta  . Number of children: 0  . Years of education: 12+  . Highest education level: Not on file  Occupational History  . Occupation: networking/IT [employer]  Tobacco Use  . Smoking status: Former Games developer  . Smokeless tobacco: Never Used  Substance and Sexual Activity  .  Alcohol use: Yes    Alcohol/week: 0.0 standard drinks    Comment: wine sometimes  . Drug use: No  . Sexual activity: Not on file  Other Topics Concern  . Not on file  Social History Narrative   Lives w/ wife    Family lives locally.   Social Determinants of Health   Financial Resource Strain:   . Difficulty of Paying Living Expenses: Not on file  Food Insecurity:   . Worried About Programme researcher, broadcasting/film/video in the Last Year: Not on file  . Ran Out of Food in the Last Year: Not on file  Transportation Needs:   . Lack of Transportation (Medical): Not on file  . Lack of Transportation (Non-Medical): Not on file  Physical Activity:   . Days of Exercise per Week: Not on file  . Minutes of Exercise per Session: Not on file  Stress:   . Feeling of Stress : Not on file  Social Connections:   . Frequency of Communication with Friends and Family: Not on file  . Frequency of Social Gatherings with Friends and Family: Not on file  . Attends Religious Services: Not on file  . Active Member of Clubs or Organizations: Not on file  . Attends Banker Meetings: Not on file  .  Marital Status: Not on file     Family History: The patient's family history includes Hyperlipidemia in his father; Hypertension in his father; Stroke (age of onset: 22) in his father. There is no history of Colon cancer or Prostate cancer. ROS:   Please see the history of present illness.    All 14 point review of systems negative except as described per history of present illness.  EKGs/Labs/Other Studies Reviewed:    The following studies were reviewed today:   EKG:  EKG is normal sinus rhythm, PACs, right axis deviation, nonspecific ST segment changes.  Ordered today.   Recent Labs: 05/29/2019: Hemoglobin 16.2; Platelets 189.0; TSH 3.03  Recent Lipid Panel    Component Value Date/Time   CHOL 223 (H) 05/29/2019 1012   TRIG 83.0 05/29/2019 1012   HDL 42.00 05/29/2019 1012   CHOLHDL 5 05/29/2019 1012    VLDL 16.6 05/29/2019 1012   LDLCALC 164 (H) 05/29/2019 1012    Physical Exam:    VS:  BP 112/84   Pulse 89   Ht 5\' 8"  (1.727 m)   Wt 165 lb (74.8 kg)   SpO2 99%   BMI 25.09 kg/m     Wt Readings from Last 3 Encounters:  06/10/19 165 lb (74.8 kg)  05/29/19 168 lb 4 oz (76.3 kg)  03/04/19 177 lb (80.3 kg)     GEN:  Well nourished, well developed in no acute distress HEENT: Normal NECK: No JVD; No carotid bruits LYMPHATICS: No lymphadenopathy CARDIAC: RRR, harsh systolic murmur best heard at the left border of the sternum towards the fourth intercostal space, grade 2/6 to 3/6.  No radiation., no rubs, no gallops RESPIRATORY:  Clear to auscultation without rales, wheezing or rhonchi  ABDOMEN: Soft, non-tender, non-distended MUSCULOSKELETAL:  No edema; No deformity  SKIN: Warm and dry NEUROLOGIC:  Alert and oriented x 3 PSYCHIATRIC:  Normal affect   ASSESSMENT:    1. Ventricular septal defect   2. Heart murmur   3. Attention deficit hyperactivity disorder (ADHD), unspecified ADHD type    PLAN:    In order of problems listed above:  1. History of ventricular septal defect.  I will schedule him to have an echocardiogram to assess left ventricle ejection fraction and right ventricle size and function.  Hemodynamically compensated.  He does not have any signs and symptoms of severe heart issues.  But, I asked him that this until we repeat echocardiogram I would prefer him not to do heavy weightlifting.  Recommendation terms of potential endocarditis prophylaxis will be made based on results of his echocardiogram. 2. Heart murmur potentially VSD.  Will await echocardiogram. 3. ADHD.  Followed by internal medicine team.  On medication seems to be stable.   Medication Adjustments/Labs and Tests Ordered: Current medicines are reviewed at length with the patient today.  Concerns regarding medicines are outlined above.  No orders of the defined types were placed in this  encounter.  No orders of the defined types were placed in this encounter.   Signed, Park Liter, MD, [facility]. 06/10/2019 9:48 AM    [place]

## 2019-06-17 ENCOUNTER — Ambulatory Visit (HOSPITAL_BASED_OUTPATIENT_CLINIC_OR_DEPARTMENT_OTHER): Payer: 59

## 2019-07-15 ENCOUNTER — Telehealth: Payer: Self-pay | Admitting: Internal Medicine

## 2019-07-15 MED ORDER — AMPHETAMINE-DEXTROAMPHET ER 20 MG PO CP24: 20.0000 mg | ORAL_CAPSULE | Freq: Every day | ORAL | 0 refills | Status: DC

## 2019-07-15 NOTE — Telephone Encounter (Signed)
Adderall refill.   Last OV: 05/29/2019 Last Fill: 03/04/2019 #60 and 0RF Pt sig: 1-2 capsules by mouth daily UDS: 05/04/2018 Low risk

## 2019-07-15 NOTE — Telephone Encounter (Signed)
PDMP okay, Rx sent 

## 2019-08-05 ENCOUNTER — Ambulatory Visit: Payer: 59 | Admitting: Cardiology

## 2019-09-27 ENCOUNTER — Telehealth: Payer: Self-pay | Admitting: Internal Medicine

## 2019-09-27 MED ORDER — AMPHETAMINE-DEXTROAMPHET ER 20 MG PO CP24: 20.0000 mg | ORAL_CAPSULE | Freq: Every day | ORAL | 0 refills | Status: DC

## 2019-09-27 NOTE — Telephone Encounter (Signed)
PDMP okay, sent 

## 2019-09-27 NOTE — Telephone Encounter (Signed)
Adderall refill.   Last OV: 05/29/2019 Last Fill: 07/15/2019 #60 and 0RF Pt sig: 1-2 capsule daily UDS: 05/04/2018 Low risk

## 2019-11-26 ENCOUNTER — Ambulatory Visit: Payer: 59 | Admitting: Internal Medicine

## 2020-01-01 ENCOUNTER — Encounter: Payer: Self-pay | Admitting: Internal Medicine

## 2020-01-01 ENCOUNTER — Ambulatory Visit (INDEPENDENT_AMBULATORY_CARE_PROVIDER_SITE_OTHER): Payer: 59 | Admitting: Internal Medicine

## 2020-01-01 ENCOUNTER — Other Ambulatory Visit: Payer: Self-pay

## 2020-01-01 VITALS — BP 107/66 | HR 75 | Temp 98.1°F | Resp 16 | Ht 68.0 in | Wt 155.2 lb

## 2020-01-01 DIAGNOSIS — Z79899 Other long term (current) drug therapy: Secondary | ICD-10-CM | POA: Diagnosis not present

## 2020-01-01 DIAGNOSIS — F909 Attention-deficit hyperactivity disorder, unspecified type: Secondary | ICD-10-CM

## 2020-01-01 DIAGNOSIS — R634 Abnormal weight loss: Secondary | ICD-10-CM | POA: Diagnosis not present

## 2020-01-01 MED ORDER — AMPHETAMINE-DEXTROAMPHET ER 20 MG PO CP24: 20.0000 mg | ORAL_CAPSULE | Freq: Every day | ORAL | 0 refills | Status: DC

## 2020-01-01 NOTE — Patient Instructions (Addendum)
   GO TO THE LAB : Provide a urine sample  GO TO THE FRONT DESK, PLEASE SCHEDULE YOUR APPOINTMENTS Come back for a physical exam in 5 months

## 2020-01-01 NOTE — Progress Notes (Signed)
Subjective:    Patient ID: Theodore Williams, male    DOB: October 16, 1995, 24 y.o.   MRN: [id number]  DOS:  01/01/2020 Type of visit - description: Routine checkup ADD: Well-controlled I noted weight loss. Patient reports he had a Covid infection around November 2020, was not a severe case however he lost his taste and develop a weird sense of smell   for months. He was forced to change his diet and he thinks that is the cause of the weight loss. Overall his weight has plateau around 150, 155.   Wt Readings from Last 3 Encounters:  01/01/20 155 lb 4 oz (70.4 kg)  06/10/19 165 lb (74.8 kg)  05/29/19 168 lb 4 oz (76.3 kg)      Review of Systems Denies fever chills No headache or night sweats No tremors No nausea, vomiting.  No diarrhea or postprandial abdominal pain No unusual arthralgias  Past Medical History:  Diagnosis Date  . Acne   . ADHD (attention deficit hyperactivity disorder)   . Heart murmur 11/21/2011   h/o    Past Surgical History:  Procedure Laterality Date  . SKIN GRAFT Left    ear    Allergies as of 01/01/2020      Reactions   Sulfa Drugs Cross Reactors Nausea And Vomiting      Medication List       Accurate as of January 01, 2020  9:27 AM. If you have any questions, ask your nurse or doctor.        amphetamine-dextroamphetamine 20 MG 24 hr capsule Commonly known as: Adderall XR Take 1-2 capsules (20-40 mg total) by mouth daily.          Objective:   Physical Exam BP 107/66 (BP Location: Left Arm, Patient Position: Sitting, Cuff Size: Small)   Pulse 75   Temp 98.1 F (36.7 C) (Oral)   Resp 16   Ht 5\' 8"  (1.727 m)   Wt 155 lb 4 oz (70.4 kg)   SpO2 99%   BMI 23.61 kg/m  General:   Well developed, NAD, BMI noted. HEENT:  Normocephalic . Face symmetric, atraumatic Neck: No thyromegaly lymphadenopathies Lungs:  CTA B Normal respiratory effort, no intercostal retractions, no accessory muscle use. Heart: RRR, + murmur.  Lower  extremities: no pretibial edema bilaterally  Skin: Not pale. Not jaundice Neurologic:  alert & oriented X3.  Speech normal, gait appropriate for age and unassisted Psych--  Cognition and judgment appear intact.  Cooperative with normal attention span and concentration.  Behavior appropriate. No anxious or depressed appearing.      Assessment      Assessment Psych: ADHD Anxiety:  Rx Lexapro 08-2015 Major depression,s/p  admission 12-2015 VSD (small, membranous), last cards visit 2014, no SBE prophylaxis per cards note 2014  PLAN: ADHD: Well-controlled, check UDS, refill Adderall. Weight loss: As described above, problem started after he had COVID November 2020 and related to change in diet d/t smell and taste issues.  For completeness I recommend a TSH, CBC etc.  But he declines labs due to insurance issues. At this point weight has plateaued and he is actually happy with a BMI of 23.  Advised patient to call me if he has any problems. VSD: Saw cardiology, was recommended echo, did not pursue. Patient education: Encouraged to get a Covid vaccination. Pros>>cons! RTC physical exam in 5 months   This visit occurred during the SARS-CoV-2 public health emergency.  Safety protocols were in place, including screening questions  prior to the visit, additional usage of staff PPE, and extensive cleaning of exam room while observing appropriate contact time as indicated for disinfecting solutions.

## 2020-01-01 NOTE — Progress Notes (Signed)
Pre visit review using our clinic review tool, if applicable. No additional management support is needed unless otherwise documented below in the visit note. 

## 2020-01-02 NOTE — Assessment & Plan Note (Signed)
ADHD: Well-controlled, check UDS, refill Adderall. Weight loss: As described above, problem started after he had COVID November 2020 and related to change in diet d/t smell and taste issues.  For completeness I recommend a TSH, CBC etc.  But he declines labs due to insurance issues. At this point weight has plateaued and he is actually happy with a BMI of 23.  Advised patient to call me if he has any problems. VSD: Saw cardiology, was recommended echo, did not pursue. Patient education: Encouraged to get a Covid vaccination. Pros>>cons! RTC physical exam in 5 months

## 2020-01-04 LAB — DRUG MONITORING, PANEL 8 WITH CONFIRMATION, URINE
6 Acetylmorphine: NEGATIVE ng/mL (ref <10)
Alcohol Metabolites: NEGATIVE ng/mL
Amphetamine: 5461 ng/mL — ABNORMAL HIGH (ref <250)
Amphetamines: POSITIVE ng/mL — AB (ref <500)
Benzodiazepines: NEGATIVE ng/mL (ref <100)
Buprenorphine, Urine: NEGATIVE ng/mL (ref <5)
Cocaine Metabolite: NEGATIVE ng/mL (ref <150)
Creatinine: 282.6 mg/dL
MDMA: NEGATIVE ng/mL (ref <500)
Marijuana Metabolite: NEGATIVE ng/mL (ref <20)
Methamphetamine: NEGATIVE ng/mL (ref <250)
Opiates: NEGATIVE ng/mL (ref <100)
Oxidant: NEGATIVE ug/mL
Oxycodone: NEGATIVE ng/mL (ref <100)
pH: 6.2 (ref 4.5–9.0)

## 2020-01-04 LAB — DM TEMPLATE

## 2020-03-13 ENCOUNTER — Telehealth: Payer: Self-pay | Admitting: Internal Medicine

## 2020-03-14 NOTE — Telephone Encounter (Signed)
Requesting: adderall Contract:n/a UDS:01/01/20 Last Visit:01/01/20 Next Visit:06/02/20 Last Refill:01/01/20  Please Advise

## 2020-03-17 MED ORDER — AMPHETAMINE-DEXTROAMPHET ER 20 MG PO CP24: 20.0000 mg | ORAL_CAPSULE | Freq: Every day | ORAL | 0 refills | Status: DC

## 2020-03-17 NOTE — Telephone Encounter (Signed)
PDMP okay, Rx sent 

## 2020-06-02 ENCOUNTER — Ambulatory Visit (INDEPENDENT_AMBULATORY_CARE_PROVIDER_SITE_OTHER): Payer: Managed Care, Other (non HMO) | Admitting: Internal Medicine

## 2020-06-02 ENCOUNTER — Other Ambulatory Visit: Payer: Self-pay

## 2020-06-02 ENCOUNTER — Encounter: Payer: Self-pay | Admitting: Internal Medicine

## 2020-06-02 VITALS — BP 120/74 | HR 74 | Temp 98.1°F | Resp 16 | Ht 68.0 in | Wt 168.4 lb

## 2020-06-02 DIAGNOSIS — R7989 Other specified abnormal findings of blood chemistry: Secondary | ICD-10-CM | POA: Diagnosis not present

## 2020-06-02 DIAGNOSIS — E785 Hyperlipidemia, unspecified: Secondary | ICD-10-CM

## 2020-06-02 DIAGNOSIS — Z0000 Encounter for general adult medical examination without abnormal findings: Secondary | ICD-10-CM | POA: Diagnosis not present

## 2020-06-02 DIAGNOSIS — Z1159 Encounter for screening for other viral diseases: Secondary | ICD-10-CM | POA: Diagnosis not present

## 2020-06-02 DIAGNOSIS — Z114 Encounter for screening for human immunodeficiency virus [HIV]: Secondary | ICD-10-CM

## 2020-06-02 DIAGNOSIS — F909 Attention-deficit hyperactivity disorder, unspecified type: Secondary | ICD-10-CM | POA: Diagnosis not present

## 2020-06-02 LAB — CBC WITH DIFFERENTIAL/PLATELET
Basophils Absolute: 0 10*3/uL (ref 0.0–0.1)
Basophils Relative: 0.5 % (ref 0.0–3.0)
Eosinophils Absolute: 0.1 10*3/uL (ref 0.0–0.7)
Eosinophils Relative: 1 % (ref 0.0–5.0)
HCT: 48.4 % (ref 39.0–52.0)
Hemoglobin: 16.6 g/dL (ref 13.0–17.0)
Lymphocytes Relative: 34.5 % (ref 12.0–46.0)
Lymphs Abs: 1.9 10*3/uL (ref 0.7–4.0)
MCHC: 34.2 g/dL (ref 30.0–36.0)
MCV: 87.4 fl (ref 78.0–100.0)
Monocytes Absolute: 0.5 10*3/uL (ref 0.1–1.0)
Monocytes Relative: 8.8 % (ref 3.0–12.0)
Neutro Abs: 3 10*3/uL (ref 1.4–7.7)
Neutrophils Relative %: 55.2 % (ref 43.0–77.0)
Platelets: 184 10*3/uL (ref 150.0–400.0)
RBC: 5.54 Mil/uL (ref 4.22–5.81)
RDW: 13.7 % (ref 11.5–15.5)
WBC: 5.4 10*3/uL (ref 4.0–10.5)

## 2020-06-02 LAB — COMPREHENSIVE METABOLIC PANEL
ALT: 18 U/L (ref 0–53)
AST: 19 U/L (ref 0–37)
Albumin: 4.6 g/dL (ref 3.5–5.2)
Alkaline Phosphatase: 97 U/L (ref 39–117)
BUN: 13 mg/dL (ref 6–23)
CO2: 31 mEq/L (ref 19–32)
Calcium: 9.5 mg/dL (ref 8.4–10.5)
Chloride: 101 mEq/L (ref 96–112)
Creatinine, Ser: 0.86 mg/dL (ref 0.40–1.50)
GFR: 119.02 mL/min (ref >60.00)
Glucose, Bld: 89 mg/dL (ref 70–99)
Potassium: 3.7 mEq/L (ref 3.5–5.1)
Sodium: 140 mEq/L (ref 135–145)
Total Bilirubin: 0.5 mg/dL (ref 0.2–1.2)
Total Protein: 7.3 g/dL (ref 6.0–8.3)

## 2020-06-02 LAB — LIPID PANEL
Cholesterol: 297 mg/dL — ABNORMAL HIGH (ref 0–200)
HDL: 50.9 mg/dL (ref >39.00)
LDL Cholesterol: 230 mg/dL — ABNORMAL HIGH (ref 0–99)
NonHDL: 246.05
Total CHOL/HDL Ratio: 6
Triglycerides: 81 mg/dL (ref 0.0–149.0)
VLDL: 16.2 mg/dL (ref 0.0–40.0)

## 2020-06-02 LAB — T4, FREE: Free T4: 0.88 ng/dL (ref 0.60–1.60)

## 2020-06-02 LAB — TSH: TSH: 3.79 u[IU]/mL (ref 0.35–4.50)

## 2020-06-02 MED ORDER — AMPHETAMINE-DEXTROAMPHET ER 20 MG PO CP24: 20.0000 mg | ORAL_CAPSULE | Freq: Every day | ORAL | 0 refills | Status: DC

## 2020-06-02 NOTE — Progress Notes (Unsigned)
   Subjective:    Patient ID: Theodore Williams, male    DOB: [birth year], 27 y.o.   MRN: [id number]  DOS:  06/02/2020 Type of visit - description: Here for CPX Here for CPX. ADHD well controlled Has no major concerns  Wt Readings from Last 3 Encounters:  06/02/20 168 lb 6 oz (76.4 kg)  01/01/20 155 lb 4 oz (70.4 kg)  06/10/19 165 lb (74.8 kg)    Review of Systems  Other than above, a 14 point review of systems is negative      Past Medical History:  Diagnosis Date  . Acne   . ADHD (attention deficit hyperactivity disorder)   . Heart murmur 11/21/2011   h/o    Past Surgical History:  Procedure Laterality Date  . SKIN GRAFT Left    ear    Allergies as of 06/02/2020      Reactions   Sulfa Drugs Cross Reactors Nausea And Vomiting      Medication List       Accurate as of June 02, 2020 11:59 PM. If you have any questions, ask your nurse or doctor.        amphetamine-dextroamphetamine 20 MG 24 hr capsule Commonly known as: Adderall XR Take 1-2 capsules (20-40 mg total) by mouth daily.          Objective:   Physical Exam BP 120/74 (BP Location: Left Arm, Patient Position: Sitting, Cuff Size: Small)   Pulse 74   Temp 98.1 F (36.7 C) (Oral)   Resp 16   Ht 5\' 8"  (1.727 m)   Wt 168 lb 6 oz (76.4 kg)   SpO2 98%   BMI 25.60 kg/m  General: Well developed, NAD, BMI noted Neck: No  thyromegaly  HEENT:  Normocephalic . Face symmetric, atraumatic Lungs:  CTA B Normal respiratory effort, no intercostal retractions, no accessory muscle use. Heart: RRR,  murmur noted.  Abdomen:  Not distended, soft, non-tender. No rebound or rigidity.   Lower extremities: no pretibial edema bilaterally  Skin: Exposed areas without rash. Not pale. Not jaundice Neurologic:  alert & oriented X3.  Speech normal, gait appropriate for age and unassisted Strength symmetric and appropriate for age.  Psych: Cognition and judgment appear intact.  Cooperative with normal  attention span and concentration.  Behavior appropriate. No anxious or depressed appearing.     Assessment     Assessment Psych: ADHD Anxiety:  Rx Lexapro 08-2015 Major depression,s/p  admission 12-2015 VSD (small, membranous), last cards visit 2014, no SBE prophylaxis per cards note 2014 Covid infection 02-2019  PLAN: Here for CPX ADHD: Well-controlled, contract signed, RF as needed History of depression: Not an issue at this point VSD: Was recommended an echo last year, could not proceed due to to insurance restraints, offered to reschedule, declined for now.  Asymptomatic. Weight loss: See last visit, resolved. Increased TSH: Checking labs RTC 6 to 8 months   This visit occurred during the SARS-CoV-2 public health emergency.  Safety protocols were in place, including screening questions prior to the visit, additional usage of staff PPE, and extensive cleaning of exam room while observing appropriate contact time as indicated for disinfecting solutions.

## 2020-06-02 NOTE — Progress Notes (Signed)
Pre visit review using our clinic review tool, if applicable. No additional management support is needed unless otherwise documented below in the visit note. 

## 2020-06-02 NOTE — Patient Instructions (Signed)
   GO TO THE LAB : Get the blood work     GO TO THE FRONT DESK, PLEASE SCHEDULE YOUR APPOINTMENTS Come back for a checkup in 6 to 8 months   Testicular Self-Exam A self-examination of your testicles (testicular self-exam) involves looking at and feeling your testicles for abnormal lumps or swelling. Several things can cause swelling, lumps, or pain in your testicles. Some of these causes are:  Injuries.  Inflammation.  Infection.  Buildup of fluids around the testicle (hydrocele).  Twisted testicles (testicular torsion).  Testicular cancer. You may be at risk for testicular cancer if you have: ? An undescended testicle (cryptorchidism). ? A history of previous testicular cancer. ? A family history of testicular cancer. General tips and recommendations  The testicles are easiest to examine after a warm bath or shower. They are more difficult to examine when you are cold because the muscles attached to the testicles retract and pull them up higher or into the abdomen.  A normal testicle is egg-shaped and feels firm. It is smooth and not tender.  It is normal to feel a firm, spaghetti-like cord at the back of your testicle. This is the spermatic cord. How to do a testicular self-exam 1. Stand and hold your penis away from your body. 2. Look at each testicle to check for changes in appearance, such as swelling or changes in size or shape. 3. Roll each testicle between your thumb and forefinger, feeling the entire testicle. Feel for: ? Lumps. ? Swelling. ? Discomfort. 4. Check the groin area between your abdomen and upper thighs on both sides of your body. Look and feel for any swelling or bumps that are tender. These could be enlarged lymph nodes.   Contact a health care provider if:  You find any bumps or lumps, such as a small, hard, pea-sized lump.  You find swelling, pain, or soreness.  You see or feel any other changes in your testicles. Summary  A self-examination  of your testicles (testicular self-exam) involves looking at and feeling your testicles for any changes.  Check each of your testicles for lumps, swelling, or discomfort. These changes can be caused by many things.  Check for swelling or tender bumps in your groin area between your lower abdomen and upper thighs. This information is not intended to replace advice given to you by your health care provider. Make sure you discuss any questions you have with your health care provider. Document Revised: 03/11/2019 Document Reviewed: 03/11/2019 Elsevier Patient Education  2021 [employer].

## 2020-06-03 ENCOUNTER — Encounter: Payer: Self-pay | Admitting: Internal Medicine

## 2020-06-03 ENCOUNTER — Telehealth: Payer: Self-pay

## 2020-06-03 LAB — HIV ANTIBODY (ROUTINE TESTING W REFLEX): HIV 1&2 Ab, 4th Generation: NONREACTIVE

## 2020-06-03 LAB — HEPATITIS C ANTIBODY
Hepatitis C Ab: NONREACTIVE
SIGNAL TO CUT-OFF: 0.01 (ref <1.00)

## 2020-06-03 NOTE — Assessment & Plan Note (Signed)
Td 2016 covid vaccine : pfizer x 1 (oct 2021), rec 2nd vax and booster, he remains hesitant, benefits discussed HPV discussed again today, declined. Flu shot declined  Labs: CMP, FLP, CBC, TSH, T4, hep C, HIV Diet and exercise: Has been less active lately, encouraged to go back to a more active lifestyle and eat healthy.

## 2020-06-03 NOTE — Assessment & Plan Note (Signed)
Here for CPX ADHD: Well-controlled, contract signed, RF as needed History of depression: Not an issue at this point VSD: Was recommended an echo last year, could not proceed due to to insurance restraints, offered to reschedule, declined for now.  Asymptomatic. Weight loss: See last visit, resolved. Increased TSH: Checking labs RTC 6 to 8 months

## 2020-06-03 NOTE — Telephone Encounter (Signed)
PA initiated via Covermymeds; KEY: [id number]. Awaiting determination.

## 2020-06-04 NOTE — Telephone Encounter (Signed)
Pt's insurance no longer covers Adderall XR 20mg  up to 2 capsule daily. Max is 1 per day. Preferred alternative is Adderall XR 30mg .

## 2020-06-05 NOTE — Telephone Encounter (Signed)
Advise patient of his insurance decision, I am willing to change to plain Adderall 20 mg BID or Adderall XR 30 mg once daily (which is less that what he is taking).

## 2020-06-05 NOTE — Telephone Encounter (Signed)
LMOM informing of his insurance decision. Informed of PCP recommendations- asked that he call or send mychart to let us know what he would prefer to do.

## 2020-06-08 NOTE — Addendum Note (Signed)
Addended byConrad [place] D on: 06/08/2020 10:10 AM   Modules accepted: Orders

## 2020-06-09 NOTE — Telephone Encounter (Signed)
Mychart message sent to Pt.

## 2020-07-15 ENCOUNTER — Telehealth: Payer: Self-pay | Admitting: Internal Medicine

## 2020-07-15 ENCOUNTER — Telehealth: Payer: Self-pay

## 2020-07-15 ENCOUNTER — Encounter: Payer: Self-pay | Admitting: Internal Medicine

## 2020-07-15 ENCOUNTER — Other Ambulatory Visit: Payer: Self-pay

## 2020-07-15 ENCOUNTER — Ambulatory Visit (INDEPENDENT_AMBULATORY_CARE_PROVIDER_SITE_OTHER): Payer: Managed Care, Other (non HMO) | Admitting: Internal Medicine

## 2020-07-15 VITALS — BP 114/74 | HR 65 | Temp 98.5°F | Resp 16 | Ht 68.0 in | Wt 164.2 lb

## 2020-07-15 DIAGNOSIS — F909 Attention-deficit hyperactivity disorder, unspecified type: Secondary | ICD-10-CM

## 2020-07-15 DIAGNOSIS — H6690 Otitis media, unspecified, unspecified ear: Secondary | ICD-10-CM | POA: Diagnosis not present

## 2020-07-15 MED ORDER — AMPHETAMINE-DEXTROAMPHET ER 20 MG PO CP24: 20.0000 mg | ORAL_CAPSULE | Freq: Every day | ORAL | 0 refills | Status: DC

## 2020-07-15 MED ORDER — AMOXICILLIN 875 MG PO TABS: 875.0000 mg | ORAL_TABLET | Freq: Two times a day (BID) | ORAL | 0 refills | Status: DC

## 2020-07-15 MED ORDER — AMOXICILLIN 875 MG PO TABS: 875.0000 mg | ORAL_TABLET | Freq: Two times a day (BID) | ORAL | 0 refills | Status: AC

## 2020-07-15 NOTE — Assessment & Plan Note (Signed)
R middle ear infection: Symptoms a started 2 days ago, other than the ear infection there is no fever chills, no evidence of bronchitis or sinusitis. Plan: Amoxicillin, Tylenol, ibuprofen, call if not better.  See AVS. ADHD: Request a refill on her Adderall XR 20 mg twice a day, it is not covered by insurance but will pay out-of-pocket.

## 2020-07-15 NOTE — Telephone Encounter (Signed)
Spoke w/ Ruben Reason- informed that Pt plans to pay out of pocket for Adderall.

## 2020-07-15 NOTE — Patient Instructions (Signed)
Take amoxicillin as prescribed for 1 week  Tylenol  500 mg OTC 2 tabs a day every 8 hours as needed for pain  IBUPROFEN (Advil or Motrin) 200 mg 2 tablets every 6 hours as needed for pain.  Always take it with food because may cause gastritis and ulcers.  If you notice nausea, stomach pain, change in the color of stools --->  Stop the medicine and let us know   Call if not gradually better  Call if you have severe symptoms, you see discharge or bleeding from the right ear

## 2020-07-15 NOTE — Progress Notes (Signed)
   Subjective:    Patient ID: Theodore Williams, male    DOB: 1995-05-15, 25 y.o.   MRN: [id number]  DOS:  07/15/2020 Type of visit - description: Acute Approximately 2 days ago developed sinus congestion associated with right ear pain. Denies any discharge or bleeding from the ear. Pain is worse at night.  Denies fever chills or sore throat No cough or chest congestion   Review of Systems See above   Past Medical History:  Diagnosis Date  . Acne   . ADHD (attention deficit hyperactivity disorder)   . Heart murmur 11/21/2011   h/o    Past Surgical History:  Procedure Laterality Date  . SKIN GRAFT Left    ear    Allergies as of 07/15/2020      Reactions   Sulfa Drugs Cross Reactors Nausea And Vomiting      Medication List       Accurate as of July 15, 2020  9:47 AM. If you have any questions, ask your nurse or doctor.        amphetamine-dextroamphetamine 20 MG 24 hr capsule Commonly known as: Adderall XR Take 1-2 capsules (20-40 mg total) by mouth daily.          Objective:   Physical Exam BP 114/74 (BP Location: Left Arm, Patient Position: Sitting, Cuff Size: Small)   Pulse 65   Temp 98.5 F (36.9 C) (Oral)   Resp 16   Ht 5\' 8"  (1.727 m)   Wt 164 lb 4 oz (74.5 kg)   SpO2 98%   BMI 24.97 kg/m  General:   Well developed, NAD, BMI noted. HEENT:  Normocephalic . Face symmetric, atraumatic. Left TM: Normal Right TM: Red, slightly bulge.  Canal is normal, trago sign negative Throat: Symmetric, no red. Nose is slightly congested, sinuses non-TTP Lungs:  CTA B Normal respiratory effort, no intercostal retractions, no accessory muscle use. Heart: RRR, + murmur.  Lower extremities: no pretibial edema bilaterally  Skin: Not pale. Not jaundice Neurologic:  alert & oriented X3.  Speech normal, gait appropriate for age and unassisted Psych--  Cognition and judgment appear intact.  Cooperative with normal attention span and concentration.  Behavior  appropriate. No anxious or depressed appearing.      Assessment      Assessment Psych: ADHD Anxiety:  Rx Lexapro 08-2015 Major depression,s/p  admission 12-2015 VSD (small, membranous), last cards visit 2014, no SBE prophylaxis per cards note 2014 Covid infection 02-2019  PLAN: R middle ear infection: Symptoms a started 2 days ago, other than the ear infection there is no fever chills, no evidence of bronchitis or sinusitis. Plan: Amoxicillin, Tylenol, ibuprofen, call if not better.  See AVS. ADHD: Request a refill on her Adderall XR 20 mg twice a day, it is not covered by insurance but will pay out-of-pocket.   This visit occurred during the SARS-CoV-2 public health emergency.  Safety protocols were in place, including screening questions prior to the visit, additional usage of staff PPE, and extensive cleaning of exam room while observing appropriate contact time as indicated for disinfecting solutions.

## 2020-07-15 NOTE — Telephone Encounter (Signed)
Received call from [facility] on [address]. Insurance does not want to cover Adderall XR 20mg  1-2 capsules daily, max is 1 capsule daily. See mychart message from 06/03/20. LMOM asking for Pt to call back to discuss.

## 2020-07-15 NOTE — Telephone Encounter (Signed)
Rx sent 

## 2020-07-15 NOTE — Telephone Encounter (Signed)
Spoke w/ Pt- made him aware, he will pay out of pocket for medication. Will make [facility] aware.

## 2020-07-15 NOTE — Telephone Encounter (Signed)
Patient states he would like  Medication sent to another pharmacy. Patient would like amoxicillin (AMOXIL) 875 MG tablet [[id number]]   sent to [facility]   [address] [place], [place] [address]  ~5.7 mi [phone]

## 2020-07-30 ENCOUNTER — Other Ambulatory Visit: Payer: Self-pay

## 2020-07-30 ENCOUNTER — Ambulatory Visit (INDEPENDENT_AMBULATORY_CARE_PROVIDER_SITE_OTHER): Payer: Managed Care, Other (non HMO) | Admitting: Internal Medicine

## 2020-07-30 ENCOUNTER — Encounter: Payer: Self-pay | Admitting: Internal Medicine

## 2020-07-30 VITALS — BP 111/73 | HR 68 | Temp 97.0°F | Ht 68.0 in | Wt 164.0 lb

## 2020-07-30 DIAGNOSIS — F32A Depression, unspecified: Secondary | ICD-10-CM

## 2020-07-30 DIAGNOSIS — F419 Anxiety disorder, unspecified: Secondary | ICD-10-CM

## 2020-07-30 MED ORDER — ESCITALOPRAM OXALATE 10 MG PO TABS: 10.0000 mg | ORAL_TABLET | Freq: Every day | ORAL | 1 refills | Status: DC

## 2020-07-30 NOTE — Patient Instructions (Addendum)
Start escitalopram 1 tablet a day.  You can take it in the morning or at bedtime  Continue seeing counselor  Call if any questions or concerns    GO TO THE FRONT DESK, PLEASE SCHEDULE YOUR APPOINTMENTS Come back for checkup in 4 to 6 weeks

## 2020-07-30 NOTE — Progress Notes (Signed)
   Subjective:    Patient ID: Theodore Williams, male    DOB: 11-11-1995, 25 y.o.   MRN: [id number]  DOS:  07/30/2020 Type of visit - description: Acute  Having marital issues, his wife left few days ago, they are separated. He is having anxiety, mild, good days and bad days overall. His father gave him Xanax which helped.  I ask about depression and he also has days that he feels sad. Sleep well most of the time. Denies any thoughts about suicide or violence.   Review of Systems See above   Past Medical History:  Diagnosis Date  . Acne   . ADHD (attention deficit hyperactivity disorder)   . Heart murmur 11/21/2011   h/o    Past Surgical History:  Procedure Laterality Date  . SKIN GRAFT Left    ear    Allergies as of 07/30/2020      Reactions   Sulfa Drugs Cross Reactors Nausea And Vomiting      Medication List       Accurate as of July 30, 2020 11:59 PM. If you have any questions, ask your nurse or doctor.        amoxicillin 875 MG tablet Commonly known as: AMOXIL Take 1 tablet (875 mg total) by mouth 2 (two) times daily.   amphetamine-dextroamphetamine 20 MG 24 hr capsule Commonly known as: Adderall XR Take 1-2 capsules (20-40 mg total) by mouth daily.   escitalopram 10 MG tablet Commonly known as: Lexapro Take 1 tablet (10 mg total) by mouth daily. Started by: Willow Ora, MD          Objective:   Physical Exam BP 111/73 (BP Location: Left Arm, Patient Position: Sitting, Cuff Size: Large)   Pulse 68   Temp (!) 97 F (36.1 C) (Temporal)   Ht 5\' 8"  (1.727 m)   Wt 164 lb (74.4 kg)   SpO2 98%   BMI 24.94 kg/m  General:   Well developed, NAD, BMI noted. HEENT:  Normocephalic . Face symmetric, atraumatic  Lower extremities: no pretibial edema bilaterally  Skin: Not pale. Not jaundice Neurologic:  alert & oriented X3.  Speech normal, gait appropriate for age and unassisted Psych--  Cognition and judgment appear intact.  Cooperative with  normal attention span and concentration.  Behavior appropriate. Slightly anxious and  sad appearing today.      Assessment    Assessment Psych: ADHD Anxiety:  Rx Lexapro 08-2015 Major depression,s/p  admission 12-2015 VSD (small, membranous), last cards visit 2014, no SBE prophylaxis per cards note 2014 Covid infection 02-2019  PLAN: Anxiety, depression: Marital issues for the last 2 months, his wife left not long ago.  He feels somewhat depressed and anxious ( PHQ-9: 2, GAD-7: 4). We talked about SSRIs versus benzos, at the end we agreed to start Lexapro 10 mg daily (has taken that before), he is aware it would take few days to kick in. He is to let me know if he ever has thoughts of suicide or violence. Already has a 03-2019. RTC 4 to 6 weeks for checkup  This visit occurred during the SARS-CoV-2 public health emergency.  Safety protocols were in place, including screening questions prior to the visit, additional usage of staff PPE, and extensive cleaning of exam room while observing appropriate contact time as indicated for disinfecting solutions.

## 2020-08-01 NOTE — Assessment & Plan Note (Signed)
Anxiety, depression: Marital issues for the last 2 months, his wife left not long ago.  He feels somewhat depressed and anxious ( PHQ-9: 2, GAD-7: 4). We talked about SSRIs versus benzos, at the end we agreed to start Lexapro 10 mg daily (has taken that before), he is aware it would take few days to kick in. He is to let me know if he ever has thoughts of suicide or violence. 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 RTC 4 to 6 weeks for checkup

## 2020-09-18 ENCOUNTER — Telehealth: Payer: Self-pay | Admitting: Internal Medicine

## 2020-09-18 MED ORDER — AMPHETAMINE-DEXTROAMPHET ER 20 MG PO CP24: 20.0000 mg | ORAL_CAPSULE | Freq: Every day | ORAL | 0 refills | Status: AC

## 2020-09-18 NOTE — Telephone Encounter (Signed)
Requesting: Adderall XR 20mg  Contract: 06/02/20 UDS: 01/01/2020 Last Visit: 07/30/2020 Next Visit: 09/28/2020 Last Refill: 07/15/2020 #60 and 0RF  Please Advise

## 2020-09-18 NOTE — Telephone Encounter (Signed)
PDMP okay, Rx sent 

## 2020-09-28 ENCOUNTER — Ambulatory Visit: Payer: Managed Care, Other (non HMO) | Admitting: Internal Medicine

## 2020-10-01 ENCOUNTER — Other Ambulatory Visit: Payer: Self-pay | Admitting: Internal Medicine

## 2021-01-01 ENCOUNTER — Ambulatory Visit: Payer: Managed Care, Other (non HMO) | Admitting: Internal Medicine

## 2021-06-17 ENCOUNTER — Encounter: Payer: Self-pay | Admitting: Internal Medicine

## 2022-07-29 ENCOUNTER — Encounter: Payer: Self-pay | Admitting: Internal Medicine
